# Patient Record
Sex: Female | Born: 1970 | Race: White | Hispanic: No | Marital: Married | Smoking: Former smoker
Health system: Southern US, Community
[De-identification: ages and names within clinical notes are randomized; demographics above are authoritative.]

## PROBLEM LIST (undated history)

## (undated) DIAGNOSIS — G43909 Migraine, unspecified, not intractable, without status migrainosus: Secondary | ICD-10-CM

## (undated) DIAGNOSIS — M1990 Unspecified osteoarthritis, unspecified site: Secondary | ICD-10-CM

## (undated) DIAGNOSIS — C9590 Leukemia, unspecified not having achieved remission: Secondary | ICD-10-CM

## (undated) DIAGNOSIS — E079 Disorder of thyroid, unspecified: Secondary | ICD-10-CM

## (undated) DIAGNOSIS — Z8719 Personal history of other diseases of the digestive system: Secondary | ICD-10-CM

## (undated) DIAGNOSIS — E039 Hypothyroidism, unspecified: Secondary | ICD-10-CM

## (undated) DIAGNOSIS — I509 Heart failure, unspecified: Secondary | ICD-10-CM

## (undated) DIAGNOSIS — M549 Dorsalgia, unspecified: Secondary | ICD-10-CM

## (undated) DIAGNOSIS — F419 Anxiety disorder, unspecified: Secondary | ICD-10-CM

## (undated) DIAGNOSIS — G8929 Other chronic pain: Secondary | ICD-10-CM

## (undated) DIAGNOSIS — C9200 Acute myeloblastic leukemia, not having achieved remission: Secondary | ICD-10-CM

## (undated) HISTORY — DX: Unspecified osteoarthritis, unspecified site: M19.90

## (undated) HISTORY — PX: FOOT SURGERY: SHX648

## (undated) HISTORY — DX: Personal history of other diseases of the digestive system: Z87.19

## (undated) HISTORY — PX: PORTACATH PLACEMENT: SHX2246

## (undated) HISTORY — PX: TUBAL LIGATION: SHX77

## (undated) HISTORY — DX: Hypothyroidism, unspecified: E03.9

## (undated) HISTORY — PX: CARDIAC DEFIBRILLATOR PLACEMENT: SHX171

## (undated) HISTORY — PX: FRACTURE SURGERY: SHX138

## (undated) HISTORY — PX: SALPINGOOPHORECTOMY: SHX82

## (undated) HISTORY — PX: ABDOMINAL HYSTERECTOMY: SHX81

---

## 2001-03-23 ENCOUNTER — Emergency Department (HOSPITAL_COMMUNITY): Admission: EM | Admit: 2001-03-23 | Discharge: 2001-03-23 | Payer: Self-pay | Admitting: *Deleted

## 2001-06-26 ENCOUNTER — Ambulatory Visit (HOSPITAL_COMMUNITY): Admission: RE | Admit: 2001-06-26 | Discharge: 2001-06-26 | Payer: Self-pay | Admitting: Obstetrics and Gynecology

## 2001-06-26 ENCOUNTER — Encounter: Payer: Self-pay | Admitting: Obstetrics and Gynecology

## 2001-11-30 ENCOUNTER — Inpatient Hospital Stay (HOSPITAL_COMMUNITY): Admission: RE | Admit: 2001-11-30 | Discharge: 2001-12-01 | Payer: Self-pay | Admitting: Obstetrics and Gynecology

## 2004-02-21 ENCOUNTER — Other Ambulatory Visit: Admission: RE | Admit: 2004-02-21 | Discharge: 2004-02-21 | Payer: Self-pay | Admitting: Dermatology

## 2005-04-03 ENCOUNTER — Ambulatory Visit (HOSPITAL_COMMUNITY): Admission: RE | Admit: 2005-04-03 | Discharge: 2005-04-03 | Payer: Self-pay | Admitting: Obstetrics and Gynecology

## 2006-01-24 ENCOUNTER — Emergency Department (HOSPITAL_COMMUNITY): Admission: EM | Admit: 2006-01-24 | Discharge: 2006-01-24 | Payer: Self-pay | Admitting: Emergency Medicine

## 2008-04-27 ENCOUNTER — Ambulatory Visit (HOSPITAL_COMMUNITY): Admission: RE | Admit: 2008-04-27 | Discharge: 2008-04-27 | Payer: Self-pay | Admitting: Internal Medicine

## 2009-03-01 ENCOUNTER — Emergency Department (HOSPITAL_COMMUNITY): Admission: EM | Admit: 2009-03-01 | Discharge: 2009-03-01 | Payer: Self-pay | Admitting: Emergency Medicine

## 2009-05-03 ENCOUNTER — Encounter
Admission: RE | Admit: 2009-05-03 | Discharge: 2009-05-03 | Payer: Self-pay | Admitting: Physical Medicine & Rehabilitation

## 2009-06-01 ENCOUNTER — Emergency Department (HOSPITAL_COMMUNITY): Admission: EM | Admit: 2009-06-01 | Discharge: 2009-06-01 | Payer: Self-pay | Admitting: Emergency Medicine

## 2009-08-18 ENCOUNTER — Emergency Department (HOSPITAL_COMMUNITY): Admission: EM | Admit: 2009-08-18 | Discharge: 2009-08-18 | Payer: Self-pay | Admitting: Emergency Medicine

## 2010-01-28 ENCOUNTER — Emergency Department (HOSPITAL_COMMUNITY): Admission: EM | Admit: 2010-01-28 | Discharge: 2010-01-28 | Payer: Self-pay | Admitting: Emergency Medicine

## 2010-05-24 ENCOUNTER — Emergency Department (HOSPITAL_COMMUNITY): Admission: EM | Admit: 2010-05-24 | Discharge: 2010-05-24 | Payer: Self-pay | Admitting: Emergency Medicine

## 2010-05-28 ENCOUNTER — Emergency Department (HOSPITAL_COMMUNITY): Admission: EM | Admit: 2010-05-28 | Discharge: 2010-05-28 | Payer: Self-pay | Admitting: Emergency Medicine

## 2010-06-01 ENCOUNTER — Ambulatory Visit (HOSPITAL_COMMUNITY): Admission: RE | Admit: 2010-06-01 | Discharge: 2010-06-01 | Payer: Self-pay | Admitting: Family Medicine

## 2010-07-01 ENCOUNTER — Emergency Department (HOSPITAL_COMMUNITY)
Admission: EM | Admit: 2010-07-01 | Discharge: 2010-07-01 | Payer: Self-pay | Source: Home / Self Care | Admitting: Emergency Medicine

## 2010-09-20 ENCOUNTER — Emergency Department (HOSPITAL_COMMUNITY)
Admission: EM | Admit: 2010-09-20 | Discharge: 2010-09-20 | Payer: Self-pay | Source: Home / Self Care | Admitting: Pediatrics

## 2010-11-11 ENCOUNTER — Encounter: Payer: Self-pay | Admitting: Obstetrics and Gynecology

## 2010-12-06 ENCOUNTER — Other Ambulatory Visit: Payer: Self-pay | Admitting: Obstetrics and Gynecology

## 2010-12-06 ENCOUNTER — Encounter (HOSPITAL_COMMUNITY): Payer: 59 | Attending: Obstetrics and Gynecology

## 2010-12-06 DIAGNOSIS — Z01812 Encounter for preprocedural laboratory examination: Secondary | ICD-10-CM | POA: Insufficient documentation

## 2010-12-06 LAB — BASIC METABOLIC PANEL
BUN: 8 mg/dL (ref 6–23)
Creatinine, Ser: 0.59 mg/dL (ref 0.4–1.2)
GFR calc non Af Amer: >60 mL/min (ref >60)
Glucose, Bld: 74 mg/dL (ref 70–99)
Sodium: 139 mEq/L (ref 135–145)

## 2010-12-06 LAB — SURGICAL PCR SCREEN
MRSA, PCR: NEGATIVE
Staphylococcus aureus: NEGATIVE

## 2010-12-06 LAB — CBC
HCT: 38.1 % (ref 36.0–46.0)
MCH: 32.5 pg (ref 26.0–34.0)
MCHC: 33.9 g/dL (ref 30.0–36.0)
RDW: 13 % (ref 11.5–15.5)

## 2010-12-07 LAB — TSH: TSH: 34.538 u[IU]/mL — ABNORMAL HIGH (ref 0.350–4.500)

## 2010-12-11 LAB — T4: T4, Total: 3.6 ug/dL — ABNORMAL LOW (ref 5.0–12.5)

## 2010-12-13 NOTE — H&P (Addendum)
  [name], [name]                ACCOUNT NO.:  [id number]  MEDICAL RECORD NO.:  [id number]           PATIENT TYPE:  LOCATION:                                 FACILITY:  PHYSICIAN:  Tilda Burrow, M.D. DATE OF BIRTH:  [birth year]  DATE OF ADMISSION:  12/06/2010 DATE OF DISCHARGE:  LH                             HISTORY & PHYSICAL   ADMISSION DIAGNOSES: 1. Symptomatic left ovarian cyst. 2. Hemorrhagic cyst versus endometrioma. 3. History of prior hysterectomy. 4. History of prior right salpingo-oophorectomy.  This 40 year old female status post vaginal hysterectomy many years ago with associated right salpingo-oophorectomy is seen in our office for persistent pain on left side that has been bothering her for "awhile and months" with severe dyspareunia associated with it for several months. Review of records show she has had intermittent cysts on the left side as far back as 2006.  When the ovary was visualized in August 2006, there was a 2.8 x 3.0 ovary with multiple small functional cysts.  Since that time, there has been significant increase in size.  On ultrasound on November 29, 2010, the ovary was 5.8 x 5.6 x 5.0 cm in diameter with a cyst within structure that is 5.3 cm x 3.8 cm x 3.4 cm.  This has a heterogeneous character fluid within it, most consistent with an endometrioma or hemorrhagic cyst.  The patient desires ovarian preservation if at all possible.  Plans are to try to extract the cyst if possible.  This patient is aware if this is not possible, then salpingo-oophorectomy may be necessary.  She is aware of this.  She is aware the risks to adjacent organs with ureter described as one of the organs, we will be watching for it during the surgery.  Injury to bowel, bladder, or bleeding requiring additional surgery have been discussed.  PAST MEDICAL HISTORY:  Positive for hypothyroidism.  SURGICAL HISTORY:  Positive for abdominal hysterectomy, appendectomy,  C- section x2, tubal ligation having been done many years before 1994.  She had a right salpingo-oophorectomy for adnexal torsion in 1988.  PHYSICAL EXAMINATION:  GENERAL:  A somber Caucasian female, alert and oriented x3. HEENT:  Pupils are equal, round, and reactive.  Extraocular movements are intact. NECK:  Supple. CHEST:  Clear to auscultation. ABDOMEN:  Slim without masses.  Well-healed surgical scar. EXTERNAL GENITALIA:  Normal.  Moderate posterior wall laxity. EXTREMITIES:  Without cyanosis, clubbing, or edema.  ASSESSMENT:  Hemorrhaging left ovarian cyst or endometrioma, symptomatic.  PLAN:  Laparoscopic left cystectomy, possible left salpingo- oophorectomy.  Bowel prep prior to surgery with serum TSH to be checked during preop labs.     Tilda Burrow, M.D.     JVF/MEDQ  D:  12/03/2010  T:  12/04/2010  Job:  [id number]  Electronically Signed by Christin Bach M.D. on 12/13/2010 06:33:39 PM

## 2010-12-17 ENCOUNTER — Other Ambulatory Visit: Payer: Self-pay | Admitting: Obstetrics and Gynecology

## 2010-12-17 ENCOUNTER — Encounter (HOSPITAL_COMMUNITY): Payer: 59 | Attending: Obstetrics and Gynecology

## 2010-12-17 DIAGNOSIS — Z01812 Encounter for preprocedural laboratory examination: Secondary | ICD-10-CM | POA: Insufficient documentation

## 2010-12-17 LAB — T3 UPTAKE: T3 Uptake Ratio: 38.3 % — ABNORMAL HIGH (ref 22.5–37.0)

## 2010-12-18 ENCOUNTER — Other Ambulatory Visit: Payer: Self-pay | Admitting: Obstetrics and Gynecology

## 2010-12-18 ENCOUNTER — Ambulatory Visit (HOSPITAL_COMMUNITY)
Admission: RE | Admit: 2010-12-18 | Discharge: 2010-12-18 | Disposition: A | Discharge status: Home / Self Care | Payer: 59 | Source: Ambulatory Visit | Attending: Obstetrics and Gynecology | Admitting: Obstetrics and Gynecology

## 2010-12-18 DIAGNOSIS — N83209 Unspecified ovarian cyst, unspecified side: Secondary | ICD-10-CM | POA: Insufficient documentation

## 2010-12-18 DIAGNOSIS — N736 Female pelvic peritoneal adhesions (postinfective): Secondary | ICD-10-CM | POA: Insufficient documentation

## 2010-12-20 NOTE — Op Note (Signed)
NAMEGRACIANA, [name]                ACCOUNT NO.:  [id number]  MEDICAL RECORD NO.:  [id number]           PATIENT TYPE:  O  LOCATION:  [facility]                          FACILITY:  [facility]  PHYSICIAN:  Tilda Burrow, M.D. DATE OF BIRTH:  11-13-1970  DATE OF PROCEDURE:  12/18/2010 DATE OF DISCHARGE:                              OPERATIVE REPORT   PREOPERATIVE DIAGNOSIS:  Symptomatic left ovarian cyst. POSTOPERATIVE DIAGNOSES:  Left ovarian cyst and left adnexal adhesions.  PROCEDURE:  Laparoscopic left salpingo-oophorectomy, lysis of adhesions.  SURGEON:  Tilda Burrow, MD ASSISTANt: Marlana Salvage, CST  ANESTHESIA:  General with endotracheal intubation. COMPLICATIONS:  None.  FINDINGS:  Extensive adhesions of left ovary to the left pelvic sidewall, closely adherent to the ureter, but able to be excised without complication.  No evidence of intra-abdominal adhesions, evidence of prior appendectomy, hysterectomy, and right salpingo-oophorectomy.  INDICATIONS:  A 40 year old female with symptomatic left tube and ovary undergoing laparoscopic evaluation and treatment.  DETAILS OF PROCEDURE:  The patient was taken to the operating room, prepped and draped.  Time-out conducted, procedure confirmed by involved parties.  IV antibiotics were not considered necessary.  Flowtrons were in place.  An infraumbilical vertical incision was made as well as a transverse suprapubic 1-cm incision at the site of an old surgical scar. Additionally, the right lower quadrant had a 5-mm trocar placed.  Veress needle head was first placed through the umbilicus using care to orient it towards the pelvis.  Water droplet test confirmed intraperitoneal location and pneumoperitoneum was achieved 3 liters under 6 mmHg pressure.  An 11-mm umbilical trocar was inserted, and a 5-mm suprapubic and right lower quadrant trocar placed under direct visualization. Pelvis was inspected.  Photo 1 shows normal bowel, tip of the  Foley catheter visible through the bladder and minimal adhesions.  Photo 2 shows the densely adherent left tube and ovary as does photo 3.  Careful inspection was performed in initial efforts, cautery were used to try to free up some of the inferior adhesions.  It became apparent that the adhesions were too dense and that salpingo-oophorectomy was considered indicated.  At this time, pelvic anatomy was inspected sufficiently to identify the left ureter in its retroperitoneal location where it coursed just beneath the ovary.  There seemed to be a small distance between the two, so retroperitoneal dissection was performed lateral to the tube and ovary, and a surgical cleavage plane made medial to the retroperitoneal vessels.  Sharp dissection using Harmonic scalpel was used to crossclamp the left infundibulopelvic ligament, transected, and gradually mobilize the ovary using care to stay close to the ovarian surfaces.  Periodic recheck of the ureter was performed to ensure that we were still well away from it.  Peristalsis could be identified regularly and during this process.  Photos 5, 6, and 7 show the ends of the final portions of the dissection with the ureter visualized as intact a few millimeters from the area of the use of harmonic scalpel. The patient tolerated the procedure well.  Irrigation showed that the area was hemostatic.  An EndoCatch bag was placed through the  suprapubic trocar site, which was converted to a 12-mm port.  The bag was unable to be extracted through the dense fibrous tissue in this area, so a larger dissection transversely was required.  The incision was eventually about 3 cm wide at the skin and similar width in the fascial layer.  At this time the tube and ovary in EndoCatch bag could be extracted.  The patient tolerated the procedure well.  Pelvis was irrigated.  Hemostasis confirmed.  Ureter again confirmed as actively peristaltic without any suspicion or  damage.  The deflation of the abdomen followed with closure of the fascial layer with 0 Vicryl at the umbilical and suprapubic site and then closure of the skin with subcuticular running 3-0 Vicryl at the suprapubic site and single subcuticular stitches at each of the site.  Steri-Strips were placed.  The patient went to recovery room in good condition.  The patient will be discharged stable, would be placed on hormone replacement therapy with Vivelle-Dot patches as well as hydrocodone and Zofran for pain and nausea.     Tilda Burrow, M.D.     JVF/MEDQ  D:  12/18/2010  T:  12/19/2010  Job:  [id number]  Electronically Signed by Christin Bach M.D. on 12/20/2010 08:48:02 PM

## 2011-01-01 LAB — URINALYSIS, ROUTINE W REFLEX MICROSCOPIC
Hgb urine dipstick: NEGATIVE
Nitrite: NEGATIVE
Protein, ur: NEGATIVE mg/dL
Urobilinogen, UA: 0.2 mg/dL (ref 0.0–1.0)

## 2011-01-04 LAB — DIFFERENTIAL
Basophils Absolute: 0 K/uL (ref 0.0–0.1)
Basophils Relative: 0 % (ref 0–1)
Eosinophils Absolute: 0.1 10*3/uL (ref 0.0–0.7)
Eosinophils Relative: 1 % (ref 0–5)
Lymphocytes Relative: 27 % (ref 12–46)
Lymphs Abs: 2.1 K/uL (ref 0.7–4.0)
Monocytes Absolute: 0.3 K/uL (ref 0.1–1.0)
Monocytes Relative: 4 % (ref 3–12)
Neutro Abs: 5.5 K/uL (ref 1.7–7.7)
Neutrophils Relative %: 68 % (ref 43–77)

## 2011-01-04 LAB — BASIC METABOLIC PANEL WITH GFR
CO2: 24 meq/L (ref 19–32)
Creatinine, Ser: 0.54 mg/dL (ref 0.4–1.2)
Glucose, Bld: 99 mg/dL (ref 70–99)
Potassium: 3.8 meq/L (ref 3.5–5.1)
Sodium: 141 meq/L (ref 135–145)

## 2011-01-04 LAB — URINALYSIS, ROUTINE W REFLEX MICROSCOPIC
Bilirubin Urine: NEGATIVE
Bilirubin Urine: NEGATIVE
Glucose, UA: NEGATIVE mg/dL
Glucose, UA: NEGATIVE mg/dL
Ketones, ur: NEGATIVE mg/dL
Nitrite: NEGATIVE
Protein, ur: NEGATIVE mg/dL
Urobilinogen, UA: 0.2 mg/dL (ref 0.0–1.0)
pH: 5 (ref 5.0–8.0)
pH: 7 (ref 5.0–8.0)

## 2011-01-04 LAB — URINE MICROSCOPIC-ADD ON

## 2011-01-04 LAB — BASIC METABOLIC PANEL
BUN: 5 mg/dL — ABNORMAL LOW (ref 6–23)
Calcium: 8.8 mg/dL (ref 8.4–10.5)
Chloride: 112 mEq/L (ref 96–112)
GFR calc Af Amer: >60 mL/min (ref >60)
GFR calc non Af Amer: >60 mL/min (ref >60)

## 2011-01-04 LAB — CBC
HCT: 36.8 % (ref 36.0–46.0)
Hemoglobin: 12.6 g/dL (ref 12.0–15.0)
MCH: 33.4 pg (ref 26.0–34.0)
MCHC: 34.2 g/dL (ref 30.0–36.0)
MCV: 97.6 fL (ref 78.0–100.0)
Platelets: 230 10*3/uL (ref 150–400)
RBC: 3.78 MIL/uL — ABNORMAL LOW (ref 3.87–5.11)
RDW: 13 % (ref 11.5–15.5)
WBC: 8.1 K/uL (ref 4.0–10.5)

## 2011-01-04 LAB — URINE CULTURE
Colony Count: 60000
Culture  Setup Time: 201108090157

## 2011-01-12 ENCOUNTER — Emergency Department (HOSPITAL_COMMUNITY)
Admission: EM | Admit: 2011-01-12 | Discharge: 2011-01-12 | Disposition: A | Discharge status: Home / Self Care | Payer: 59 | Attending: Emergency Medicine | Admitting: Emergency Medicine

## 2011-01-12 DIAGNOSIS — F3289 Other specified depressive episodes: Secondary | ICD-10-CM | POA: Insufficient documentation

## 2011-01-12 DIAGNOSIS — M26609 Unspecified temporomandibular joint disorder, unspecified side: Secondary | ICD-10-CM | POA: Insufficient documentation

## 2011-01-12 DIAGNOSIS — F329 Major depressive disorder, single episode, unspecified: Secondary | ICD-10-CM | POA: Insufficient documentation

## 2011-01-12 DIAGNOSIS — R51 Headache: Secondary | ICD-10-CM | POA: Insufficient documentation

## 2011-01-12 DIAGNOSIS — M549 Dorsalgia, unspecified: Secondary | ICD-10-CM | POA: Insufficient documentation

## 2011-01-12 DIAGNOSIS — G8929 Other chronic pain: Secondary | ICD-10-CM | POA: Insufficient documentation

## 2011-01-24 LAB — DIFFERENTIAL
Eosinophils Absolute: 0.2 10*3/uL (ref 0.0–0.7)
Monocytes Absolute: 0.2 10*3/uL (ref 0.1–1.0)
Monocytes Relative: 2 % — ABNORMAL LOW (ref 3–12)
Neutrophils Relative %: 68 % (ref 43–77)

## 2011-01-24 LAB — CBC
Hemoglobin: 14.9 g/dL (ref 12.0–15.0)
MCV: 97.9 fL (ref 78.0–100.0)
RBC: 4.52 MIL/uL (ref 3.87–5.11)
RDW: 14.1 % (ref 11.5–15.5)
WBC: 12.8 10*3/uL — ABNORMAL HIGH (ref 4.0–10.5)

## 2011-01-24 LAB — RAPID URINE DRUG SCREEN, HOSP PERFORMED
Amphetamines: NOT DETECTED
Barbiturates: NOT DETECTED
Cocaine: NOT DETECTED
Opiates: POSITIVE — AB
Tetrahydrocannabinol: NOT DETECTED

## 2011-01-24 LAB — BASIC METABOLIC PANEL
CO2: 25 mEq/L (ref 19–32)
Calcium: 9.2 mg/dL (ref 8.4–10.5)
Creatinine, Ser: 0.67 mg/dL (ref 0.4–1.2)
GFR calc Af Amer: >60 mL/min (ref >60)
Glucose, Bld: 87 mg/dL (ref 70–99)
Potassium: 3.9 mEq/L (ref 3.5–5.1)

## 2011-02-08 ENCOUNTER — Emergency Department (HOSPITAL_COMMUNITY)
Admission: EM | Admit: 2011-02-08 | Discharge: 2011-02-08 | Disposition: A | Discharge status: Home / Self Care | Payer: Self-pay | Attending: Emergency Medicine | Admitting: Emergency Medicine

## 2011-02-08 DIAGNOSIS — M26609 Unspecified temporomandibular joint disorder, unspecified side: Secondary | ICD-10-CM | POA: Insufficient documentation

## 2011-02-08 DIAGNOSIS — G4700 Insomnia, unspecified: Secondary | ICD-10-CM | POA: Insufficient documentation

## 2011-02-08 DIAGNOSIS — R51 Headache: Secondary | ICD-10-CM | POA: Insufficient documentation

## 2011-08-05 ENCOUNTER — Other Ambulatory Visit: Payer: Self-pay | Admitting: Obstetrics and Gynecology

## 2011-08-15 ENCOUNTER — Other Ambulatory Visit: Payer: Self-pay | Admitting: Obstetrics and Gynecology

## 2012-01-02 ENCOUNTER — Other Ambulatory Visit: Payer: Self-pay | Admitting: Obstetrics and Gynecology

## 2012-03-19 ENCOUNTER — Encounter (HOSPITAL_COMMUNITY): Payer: Self-pay

## 2012-03-19 ENCOUNTER — Emergency Department (HOSPITAL_COMMUNITY)
Admission: EM | Admit: 2012-03-19 | Discharge: 2012-03-19 | Disposition: A | Discharge status: Home / Self Care | Payer: Self-pay | Attending: Emergency Medicine | Admitting: Emergency Medicine

## 2012-03-19 ENCOUNTER — Emergency Department (HOSPITAL_COMMUNITY): Payer: Self-pay

## 2012-03-19 DIAGNOSIS — X58XXXA Exposure to other specified factors, initial encounter: Secondary | ICD-10-CM | POA: Insufficient documentation

## 2012-03-19 DIAGNOSIS — S46919A Strain of unspecified muscle, fascia and tendon at shoulder and upper arm level, unspecified arm, initial encounter: Secondary | ICD-10-CM

## 2012-03-19 DIAGNOSIS — IMO0002 Reserved for concepts with insufficient information to code with codable children: Secondary | ICD-10-CM | POA: Insufficient documentation

## 2012-03-19 DIAGNOSIS — E079 Disorder of thyroid, unspecified: Secondary | ICD-10-CM | POA: Insufficient documentation

## 2012-03-19 HISTORY — DX: Disorder of thyroid, unspecified: E07.9

## 2012-03-19 MED ORDER — HYDROCODONE-ACETAMINOPHEN 5-325 MG PO TABS: 1.0000 | ORAL_TABLET | ORAL | Status: DC | PRN

## 2012-03-19 MED ORDER — MELOXICAM 7.5 MG PO TABS: ORAL_TABLET | ORAL | Status: DC

## 2012-03-19 NOTE — ED Provider Notes (Signed)
History     CSN: [id number]  Arrival date & time 03/19/12  1201   First MD Initiated Contact with Patient 03/19/12 1258      Chief Complaint  Patient presents with  . Shoulder Pain    (Consider location/radiation/quality/duration/timing/severity/associated sxs/prior treatment) Patient is a 41 y.o. female presenting with shoulder pain. The history is provided by the patient.  Shoulder Pain This is a new problem. The current episode started 1 to 4 weeks ago. The problem occurs intermittently. The problem has been gradually worsening. Associated symptoms include arthralgias. Pertinent negatives include no abdominal pain, chest pain, coughing or neck pain. Exacerbated by: certain movement. She has tried nothing for the symptoms.    Past Medical History  Diagnosis Date  . Thyroid disease     Past Surgical History  Procedure Date  . Fracture surgery   . Abdominal hysterectomy     No family history on file.  History  Substance Use Topics  . Smoking status: Current Everyday Smoker  . Smokeless tobacco: Not on file  . Alcohol Use: No    OB History    Grav Para Term Preterm Abortions TAB SAB Ect Mult Living                  Review of Systems  Constitutional: Negative for activity change.       All ROS Neg except as noted in HPI  HENT: Negative for nosebleeds and neck pain.   Eyes: Negative for photophobia and discharge.  Respiratory: Negative for cough, shortness of breath and wheezing.   Cardiovascular: Negative for chest pain and palpitations.  Gastrointestinal: Negative for abdominal pain and blood in stool.  Genitourinary: Negative for dysuria, frequency and hematuria.  Musculoskeletal: Positive for arthralgias. Negative for back pain.  Skin: Negative.   Neurological: Negative for dizziness, seizures and speech difficulty.  Psychiatric/Behavioral: Negative for hallucinations and confusion.    Allergies  Review of patient's allergies indicates no known  allergies.  Home Medications  No current outpatient prescriptions on file.  BP 96/68  Pulse 90  Temp(Src) 98.4 F (36.9 C) (Oral)  Resp 20  Ht 5\' 6"  (1.676 m)  Wt 120 lb (54.432 kg)  BMI 19.37 kg/m2  SpO2 97%  Physical Exam  Nursing note and vitals reviewed. Constitutional: She is oriented to person, place, and time. She appears well-developed and well-nourished.  Non-toxic appearance.  HENT:  Head: Normocephalic.  Right Ear: Tympanic membrane and external ear normal.  Left Ear: Tympanic membrane and external ear normal.  Eyes: EOM and lids are normal. Pupils are equal, round, and reactive to light.  Neck: Normal range of motion. Neck supple. Carotid bruit is not present.  Cardiovascular: Normal rate, regular rhythm, normal heart sounds, intact distal pulses and normal pulses.   Pulmonary/Chest: Breath sounds normal. No respiratory distress.  Abdominal: Soft. Bowel sounds are normal. There is no tenderness. There is no guarding.  Musculoskeletal: Normal range of motion.       There is pain with range of motion of the left shoulder. There is no deformity appreciated. There is some crepitus present. Pain is more anterior. Distal pulses are symmetrical. Sensory is symmetrical.  Lymphadenopathy:       Head (right side): No submandibular adenopathy present.       Head (left side): No submandibular adenopathy present.    She has no cervical adenopathy.  Neurological: She is alert and oriented to person, place, and time. She has normal strength. No cranial nerve deficit or  sensory deficit.       Grip is symmetrical bilaterally.  Skin: Skin is warm and dry.  Psychiatric: She has a normal mood and affect. Her speech is normal.    ED Course  Procedures (including critical care time)  Labs Reviewed - No data to display Dg Shoulder Left  03/19/2012  *RADIOLOGY REPORT*  Clinical Data: Pain without trauma.  LEFT SHOULDER - 2+ VIEW  Comparison: None.  Findings: Minimal degenerative  irregularity of the acromioclavicular joint and undersurface of the acromion. No acute fracture or dislocation.  Visualized portion of the left hemithorax is normal.  IMPRESSION: No acute osseous abnormality.  Original Report Authenticated By: Consuello Bossier, M.D.     No diagnosis found.    MDM  I have reviewed nursing notes, vital signs, and all appropriate lab and imaging results for this patient. Exam consistent with shoulder strain. Pt fitted with sling. Treated with mobic, norco5mg .       Kathie Dike, PA 03/19/12 1337  Kathie Dike, [place] 03/19/12 1350

## 2012-03-19 NOTE — ED Notes (Signed)
Pain in left shoulder for two weeks

## 2012-03-19 NOTE — ED Notes (Signed)
Patient transported to X-ray 

## 2012-03-19 NOTE — ED Notes (Signed)
Pt with left shoulder pain for weeks

## 2012-03-19 NOTE — Discharge Instructions (Signed)
Please use the shoulder sling for the next 3 to 4 days. Mobic daily with food for inflammation. Norco for pain. Take with benadryl for itching. See Dr Gerda Diss for orthopedic referral if not improving.Shoulder Sprain A shoulder sprain is the result of damage to the tough, fiber-like tissues (ligaments) that help hold your shoulder in place. The ligaments may be stretched or torn. Besides the main shoulder joint (the ball and socket), there are several smaller joints that connect the bones in this area. A sprain usually involves one of those joints. Most often it is the acromioclavicular (or AC) joint. That is the joint that connects the collarbone (clavicle) and the shoulder blade (scapula) at the top point of the shoulder blade (acromion). A shoulder sprain is a mild form of what is called a shoulder separation. Recovering from a shoulder sprain may take some time. For some, pain lingers for several months. Most people recover without long term problems. CAUSES   A shoulder sprain is usually caused by some kind of trauma. This might be:   Falling on an outstretched arm.   Being hit hard on the shoulder.   Twisting the arm.   Shoulder sprains are more likely to occur in people who:   Play sports.   Have balance or coordination problems.  SYMPTOMS   Pain when you move your shoulder.   Limited ability to move the shoulder.   Swelling and tenderness on top of the shoulder.   Redness or warmth in the shoulder.   Bruising.   A change in the shape of the shoulder.  DIAGNOSIS  Your healthcare provider may:  Ask about your symptoms.   Ask about recent activity that might have caused those symptoms.   Examine your shoulder. You may be asked to do simple exercises to test movement. The other shoulder will be examined for comparison.   Order some tests that provide a look inside the body. They can show the extent of the injury. The tests could include:   X-rays.   CT (computed  tomography) scan.   MRI (magnetic resonance imaging) scan.  RISKS AND COMPLICATIONS  Loss of full shoulder motion.   Ongoing shoulder pain.  TREATMENT  How long it takes to recover from a shoulder sprain depends on how severe it was. Treatment options may include:  Rest. You should not use the arm or shoulder until it heals.   Ice. For 2 or 3 days after the injury, put an ice pack on the shoulder up to 4 times a day. It should stay on for 15 to 20 minutes each time. Wrap the ice in a towel so it does not touch your skin.   Over-the-counter medicine to relieve pain.   A sling or brace. This will keep the arm still while the shoulder is healing.   Physical therapy or rehabilitation exercises. These will help you regain strength and motion. Ask your healthcare provider when it is OK to begin these exercises.   Surgery. The need for surgery is rare with a sprained shoulder, but some people may need surgery to keep the joint in place and reduce pain.  HOME CARE INSTRUCTIONS   Ask your healthcare provider about what you should and should not do while your shoulder heals.   Make sure you know how to apply ice to the correct area of your shoulder.   Talk with your healthcare provider about which medications should be used for pain and swelling.   If rehabilitation therapy will  be needed, ask your healthcare provider to refer you to a therapist. If it is not recommended, then ask about at-home exercises. Find out when exercise should begin.  SEEK MEDICAL CARE IF:  Your pain, swelling, or redness at the joint increases. SEEK IMMEDIATE MEDICAL CARE IF:   You have a fever.   You cannot move your arm or shoulder.  Document Released: 02/23/2009 Document Revised: 09/26/2011 Document Reviewed: 02/23/2009 [facility] Patient Information 2012 [place], [place].

## 2012-03-20 NOTE — ED Provider Notes (Signed)
Medical screening examination/treatment/procedure(s) were performed by non-physician practitioner and as supervising physician I was immediately available for consultation/collaboration.  Sonji Starkes R. Lennon Richins, MD 03/20/12 0658 

## 2012-03-25 ENCOUNTER — Encounter (HOSPITAL_COMMUNITY): Payer: Self-pay | Admitting: Oncology

## 2012-03-25 ENCOUNTER — Emergency Department (HOSPITAL_COMMUNITY)
Admission: EM | Admit: 2012-03-25 | Discharge: 2012-03-25 | Disposition: A | Discharge status: Home / Self Care | Payer: BC Managed Care – PPO | Attending: Emergency Medicine | Admitting: Emergency Medicine

## 2012-03-25 ENCOUNTER — Other Ambulatory Visit: Payer: Self-pay | Admitting: Family Medicine

## 2012-03-25 DIAGNOSIS — Z79899 Other long term (current) drug therapy: Secondary | ICD-10-CM | POA: Insufficient documentation

## 2012-03-25 DIAGNOSIS — M25512 Pain in left shoulder: Secondary | ICD-10-CM

## 2012-03-25 DIAGNOSIS — E079 Disorder of thyroid, unspecified: Secondary | ICD-10-CM | POA: Insufficient documentation

## 2012-03-25 DIAGNOSIS — M25519 Pain in unspecified shoulder: Secondary | ICD-10-CM | POA: Insufficient documentation

## 2012-03-25 DIAGNOSIS — F17200 Nicotine dependence, unspecified, uncomplicated: Secondary | ICD-10-CM | POA: Insufficient documentation

## 2012-03-25 HISTORY — DX: Anxiety disorder, unspecified: F41.9

## 2012-03-25 MED ORDER — OXYCODONE-ACETAMINOPHEN 10-325 MG PO TABS: 1.0000 | ORAL_TABLET | ORAL | Status: AC | PRN

## 2012-03-25 MED ORDER — CLONAZEPAM 0.5 MG PO TABS: 1.0000 mg | ORAL_TABLET | Freq: Two times a day (BID) | ORAL | Status: DC | PRN

## 2012-03-25 NOTE — ED Notes (Signed)
[name] states she is presenting to the ER for left shoulder pain; reports that she is going for MRIs soon and to see Romeo Apple.  Is in need of pain meds until she is able.

## 2012-03-25 NOTE — ED Provider Notes (Signed)
History     CSN: [id number]  Arrival date & time 03/25/12  1107   First MD Initiated Contact with Patient 03/25/12 1119      Chief Complaint  Patient presents with  . Shoulder Pain    left    (Consider location/radiation/quality/duration/timing/severity/associated sxs/prior treatment) HPI....chronic left shoulder pain for several weeks. Has appointment with orthopedic surgeon.   MRI scheduled for next week.no Radicular symptoms. Palpation and movement makes pain worse. Rest makes pain better. Also complains of muscle spasm.severity is moderate and sharp  Past Medical History  Diagnosis Date  . Thyroid disease   . Anxiety     Past Surgical History  Procedure Date  . Abdominal hysterectomy   . Fracture surgery     pelvis, right arm    No family history on file.  History  Substance Use Topics  . Smoking status: Current Everyday Smoker  . Smokeless tobacco: Not on file  . Alcohol Use: No    OB History    Grav Para Term Preterm Abortions TAB SAB Ect Mult Living                  Review of Systems  All other systems reviewed and are negative.    Allergies  Review of patient's allergies indicates no known allergies.  Home Medications   Current Outpatient Rx  Name Route Sig Dispense Refill  . CLONAZEPAM 2 MG PO TABS Oral Take 2 mg by mouth 3 (three) times daily as needed. For anxiety    . ESTRACE PO Oral Take 1 tablet by mouth daily.    Marland Kitchen HYDROCODONE-ACETAMINOPHEN 5-325 MG PO TABS Oral Take 1 tablet by mouth every 4 (four) hours as needed.    Marland Kitchen LEVOTHYROXINE SODIUM 100 MCG PO TABS Oral Take 100 mcg by mouth daily.    . MELOXICAM 7.5 MG PO TABS  1 po bid with food    . CLONAZEPAM 0.5 MG PO TABS Oral Take 2 tablets (1 mg total) by mouth 2 (two) times daily as needed for anxiety. 15 tablet 0  . OXYCODONE-ACETAMINOPHEN 10-325 MG PO TABS Oral Take 1 tablet by mouth every 4 (four) hours as needed for pain. 30 tablet 0    BP 117/86  Pulse 97  Temp(Src) 98.3 F  (36.8 C) (Oral)  Resp 16  Ht 5\' 6"  (1.676 m)  Wt 125 lb (56.7 kg)  BMI 20.18 kg/m2  SpO2 100%  Physical Exam  Nursing note and vitals reviewed. Constitutional: She is oriented to person, place, and time. She appears well-developed and well-nourished.  HENT:  Head: Normocephalic and atraumatic.  Eyes: Conjunctivae and EOM are normal. Pupils are equal, round, and reactive to light.  Neck: Normal range of motion. Neck supple.  Musculoskeletal:       Left shoulder: Pain with range of motion. Distal pulses intact   Neurological: She is alert and oriented to person, place, and time.  Skin: Skin is warm and dry.  Psychiatric: She has a normal mood and affect.    ED Course  Procedures (including critical care time)  Labs Reviewed - No data to display No results found.   1. Pain in left shoulder       MDM  Patient needs pain management and Rx for muscle spasm. Has defined appointment with orthopedic surgeon and MRI scheduled. We'll attempt to control pain and muscle spasm for the next 10-15 days while she is waiting for final workup        Donnetta Hutching,  MD 03/25/12 1311

## 2012-03-25 NOTE — ED Notes (Signed)
Pain lt shoulder for 2 mos, here for pain control.  Being evaluated by Dr Romeo Apple.  Requests to see Dr Adriana Simas.Pt does not know how or if she  Injured her shoulder.

## 2012-03-25 NOTE — Discharge Instructions (Signed)
followup with your orthopedic doctor for MRI scan. Medication for pain and muscle relaxation. Ice, elevation, rest.

## 2012-04-02 ENCOUNTER — Ambulatory Visit (HOSPITAL_COMMUNITY): Admission: RE | Admit: 2012-04-02 | Payer: BC Managed Care – PPO | Source: Ambulatory Visit

## 2012-05-01 ENCOUNTER — Other Ambulatory Visit: Payer: Self-pay | Admitting: Obstetrics and Gynecology

## 2012-05-15 ENCOUNTER — Emergency Department (HOSPITAL_COMMUNITY)
Admission: EM | Admit: 2012-05-15 | Discharge: 2012-05-16 | Disposition: A | Discharge status: Home / Self Care | Payer: Self-pay | Attending: Emergency Medicine | Admitting: Emergency Medicine

## 2012-05-15 ENCOUNTER — Encounter (HOSPITAL_COMMUNITY): Payer: Self-pay | Admitting: *Deleted

## 2012-05-15 DIAGNOSIS — Z79899 Other long term (current) drug therapy: Secondary | ICD-10-CM | POA: Insufficient documentation

## 2012-05-15 DIAGNOSIS — F411 Generalized anxiety disorder: Secondary | ICD-10-CM | POA: Insufficient documentation

## 2012-05-15 DIAGNOSIS — F1920 Other psychoactive substance dependence, uncomplicated: Secondary | ICD-10-CM

## 2012-05-15 DIAGNOSIS — E079 Disorder of thyroid, unspecified: Secondary | ICD-10-CM | POA: Insufficient documentation

## 2012-05-15 DIAGNOSIS — F17200 Nicotine dependence, unspecified, uncomplicated: Secondary | ICD-10-CM | POA: Insufficient documentation

## 2012-05-15 DIAGNOSIS — F1110 Opioid abuse, uncomplicated: Secondary | ICD-10-CM | POA: Insufficient documentation

## 2012-05-15 LAB — URINALYSIS, ROUTINE W REFLEX MICROSCOPIC
Glucose, UA: NEGATIVE mg/dL
Ketones, ur: NEGATIVE mg/dL
Leukocytes, UA: NEGATIVE
Nitrite: NEGATIVE
Protein, ur: NEGATIVE mg/dL

## 2012-05-15 LAB — CBC WITH DIFFERENTIAL/PLATELET
Basophils Absolute: 0 10*3/uL (ref 0.0–0.1)
Basophils Relative: 0 % (ref 0–1)
Eosinophils Relative: 4 % (ref 0–5)
HCT: 38.1 % (ref 36.0–46.0)
MCHC: 33.6 g/dL (ref 30.0–36.0)
MCV: 94.8 fL (ref 78.0–100.0)
Monocytes Absolute: 0.4 10*3/uL (ref 0.1–1.0)
Monocytes Relative: 6 % (ref 3–12)
RDW: 14.3 % (ref 11.5–15.5)

## 2012-05-15 LAB — BASIC METABOLIC PANEL
BUN: 10 mg/dL (ref 6–23)
CO2: 26 mEq/L (ref 19–32)
Calcium: 9.2 mg/dL (ref 8.4–10.5)
Creatinine, Ser: 0.59 mg/dL (ref 0.50–1.10)
GFR calc Af Amer: >90 mL/min (ref >90)

## 2012-05-15 LAB — TSH: TSH: 8.678 u[IU]/mL — ABNORMAL HIGH (ref 0.350–4.500)

## 2012-05-15 LAB — RAPID URINE DRUG SCREEN, HOSP PERFORMED
Opiates: POSITIVE — AB
Tetrahydrocannabinol: NOT DETECTED

## 2012-05-15 LAB — ETHANOL: Alcohol, Ethyl (B): <11 mg/dL (ref 0–11)

## 2012-05-15 MED ORDER — ALUM & MAG HYDROXIDE-SIMETH 200-200-20 MG/5ML PO SUSP
30.0000 mL | ORAL | Status: DC | PRN
Start: 2012-05-15 — End: 2012-05-16

## 2012-05-15 MED ORDER — NICOTINE 21 MG/24HR TD PT24
21.0000 mg | MEDICATED_PATCH | Freq: Every day | TRANSDERMAL | Status: DC
  Administered 2012-05-15: 21 mg via TRANSDERMAL
  Filled 2012-05-15: qty 1

## 2012-05-15 MED ORDER — ZOLPIDEM TARTRATE 5 MG PO TABS
5.0000 mg | ORAL_TABLET | Freq: Every evening | ORAL | Status: DC | PRN
  Administered 2012-05-15: 5 mg via ORAL
  Filled 2012-05-15: qty 1

## 2012-05-15 MED ORDER — LEVOTHYROXINE SODIUM 100 MCG PO TABS
100.0000 ug | ORAL_TABLET | Freq: Every day | ORAL | Status: DC
  Filled 2012-05-15: qty 1

## 2012-05-15 MED ORDER — IBUPROFEN 600 MG PO TABS
600.0000 mg | ORAL_TABLET | Freq: Three times a day (TID) | ORAL | Status: DC | PRN
  Administered 2012-05-15: 600 mg via ORAL
  Filled 2012-05-15: qty 1

## 2012-05-15 MED ORDER — CLONAZEPAM 1 MG PO TABS
2.0000 mg | ORAL_TABLET | Freq: Three times a day (TID) | ORAL | Status: DC | PRN
  Administered 2012-05-15: 2 mg via ORAL
  Filled 2012-05-15: qty 2

## 2012-05-15 MED ORDER — ONDANSETRON HCL 4 MG PO TABS: 4.0000 mg | ORAL_TABLET | Freq: Three times a day (TID) | ORAL | Status: DC | PRN

## 2012-05-15 MED ORDER — ACETAMINOPHEN 325 MG PO TABS: 650.0000 mg | ORAL_TABLET | ORAL | Status: DC | PRN

## 2012-05-15 MED ORDER — LORAZEPAM 1 MG PO TABS
1.0000 mg | ORAL_TABLET | Freq: Three times a day (TID) | ORAL | Status: DC | PRN
  Administered 2012-05-15: 1 mg via ORAL
  Filled 2012-05-15: qty 1

## 2012-05-15 NOTE — ED Provider Notes (Signed)
History     CSN: [id number]  Arrival date & time 05/15/12  0909   First MD Initiated Contact with Patient 05/15/12 0913      Chief Complaint  Patient presents with  . Medical Clearance    (Consider location/radiation/quality/duration/timing/severity/associated sxs/prior treatment) HPI..... 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  Takes up to 20 Vicodin equivalents per day.  Has had trouble with opiate addiction since 04/07/2006 when her brother died. One previous admission to [facility].  No suicidal or homicidal ideation.  Minimal anxiety.  History of hypothyroidism. No other somatic complaints.  Past Medical History  Diagnosis Date  . Thyroid disease   . Anxiety     Past Surgical History  Procedure Date  . Abdominal hysterectomy   . Fracture surgery     pelvis, right arm    No family history on file.  History  Substance Use Topics  . Smoking status: Current Everyday Smoker  . Smokeless tobacco: Not on file  . Alcohol Use: No    OB History    Grav Para Term Preterm Abortions TAB SAB Ect Mult Living                  Review of Systems  All other systems reviewed and are negative.    Allergies  Review of patient's allergies indicates no known allergies.  Home Medications   Current Outpatient Rx  Name Route Sig Dispense Refill  . CLONAZEPAM 2 MG PO TABS Oral Take 2 mg by mouth 3 (three) times daily as needed. For anxiety    . ESTRADIOL 1 MG PO TABS Oral Take 1 mg by mouth daily.    Marland Kitchen HYDROCODONE-ACETAMINOPHEN 7.5-325 MG PO TABS Oral Take 6-8 tablets by mouth every 6 (six) hours as needed. For pain.    Marland Kitchen LEVOTHYROXINE SODIUM 100 MCG PO TABS Oral Take 100 mcg by mouth daily.    . OXYCODONE-ACETAMINOPHEN 5-500 MG PO TABS Oral Take 6-8 tablets by mouth every 6 (six) hours as needed. For pain.      BP 108/77  Pulse 100  Temp 98.3 F (36.8 C) (Oral)  Resp 16  SpO2 100%  Physical Exam  Nursing note and vitals reviewed. Constitutional: She is oriented to person, place, and time.  She appears well-developed and well-nourished.  HENT:  Head: Normocephalic and atraumatic.  Eyes: Conjunctivae and EOM are normal. Pupils are equal, round, and reactive to light.  Neck: Normal range of motion. Neck supple.  Cardiovascular: Normal rate and regular rhythm.   Pulmonary/Chest: Effort normal and breath sounds normal.  Abdominal: Soft. Bowel sounds are normal.  Musculoskeletal: Normal range of motion.  Neurological: She is alert and oriented to person, place, and time.  Skin: Skin is warm and dry.  Psychiatric: She has a normal mood and affect.    ED Course  Procedures (including critical care time)   Labs Reviewed  CBC WITH DIFFERENTIAL  BASIC METABOLIC PANEL  URINE RAPID DRUG SCREEN (HOSP PERFORMED)  ETHANOL  URINALYSIS, ROUTINE W REFLEX MICROSCOPIC   No results found.   No diagnosis found.    MDM  Patient is lucid. Consult behavioral health.        Donnetta Hutching, MD 05/15/12 1014

## 2012-05-15 NOTE — ED Notes (Signed)
Pt up to bathroom.

## 2012-05-15 NOTE — ED Notes (Signed)
Patient changed into paper scrubs and checked by security. Patient belongings checked by security. 1 bag placed in triage locker 4

## 2012-05-15 NOTE — ED Notes (Signed)
MD notified of pt's VS, he advised to just keep an eye on it for now.

## 2012-05-15 NOTE — ED Notes (Signed)
Contacted EDP about TSH level since I don't think the comment i put in was seen and also her home meds need to be ordered.
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
## 2012-05-15 NOTE — Progress Notes (Signed)
Pt has been accepted to [facility]. Pt's [place] Authorization # is [id number] for 3 days given by Marylene Land. Pt has a 2 week supply of her medications and her husband has agreed to transport. EDP has been notified and is in agreement with the disposition. RN made aware to call report. No further needs identified at this time.

## 2012-05-15 NOTE — ED Notes (Signed)
[name], CSW in with pt doing ACT assessment.

## 2012-05-15 NOTE — ED Notes (Signed)
Pt reports wanting detox from hydrocodone/percocet. Last use last night. Usually uses 20-30 pills per day PO. Denies SI/HI. Calm/cooperative. [facility] and was told to come here for help with detox.

## 2012-05-15 NOTE — Progress Notes (Signed)
Pt's information was sent to [facility] at 17:24. CSW contacted Baird Lyons at [facility] at 20:30 who stated she had not reviewed the information at this time though would contact this CSW back this evening. CSW updated the pt on the progress of her placement. CSW will continue to follow.

## 2012-05-16 NOTE — ED Notes (Signed)
Patient discharge to [facility] with husband via ambulatory with steady gait. Respirations equal and unlabored. Skin warm and dry. No acute distress noted.

## 2013-03-29 ENCOUNTER — Telehealth: Payer: Self-pay | Admitting: Obstetrics and Gynecology

## 2013-03-30 NOTE — Telephone Encounter (Signed)
Left patient a message that pharmacy can request medications refills unless there is pain medicines. If pain medicines are being requested patient will need to be seen

## 2013-03-31 ENCOUNTER — Telehealth: Payer: Self-pay | Admitting: *Deleted

## 2013-03-31 ENCOUNTER — Telehealth: Payer: Self-pay | Admitting: Obstetrics and Gynecology

## 2013-03-31 NOTE — Telephone Encounter (Signed)
Pt requesting Dr. Emelda Fear to return her call, pt missed his phone call from yesterday.

## 2013-03-31 NOTE — Telephone Encounter (Signed)
Pt requesting Dr. Emelda Fear to refill prescription for Valium and Celexa that pt has been receiving from [facility]. Informed pt would need an appt before Dr. Emelda Fear could prescribe these medications. Transferred call up to front staff to verify charges and appt availability.

## 2013-04-06 ENCOUNTER — Encounter: Payer: Self-pay | Admitting: *Deleted

## 2013-04-07 ENCOUNTER — Ambulatory Visit: Payer: Self-pay | Admitting: Obstetrics and Gynecology

## 2013-04-07 ENCOUNTER — Telehealth: Payer: Self-pay | Admitting: Obstetrics and Gynecology

## 2013-04-07 NOTE — Telephone Encounter (Signed)
Spoke with pt. Missed appt for today, she thought appt was for tomorrow. Pt wants Dr. Emelda Fear to refill Celexa and Valium, about to run out. Explained that pt would have to be seen before any meds would be given. Dr. Emelda Fear off tomorrow so appt scheduled for Friday. JSY

## 2013-04-09 ENCOUNTER — Ambulatory Visit: Payer: Self-pay | Admitting: Obstetrics and Gynecology

## 2013-04-12 ENCOUNTER — Telehealth: Payer: Self-pay

## 2013-04-12 NOTE — Telephone Encounter (Signed)
I spoke with patient regarding appointment with Dr. Emelda Fear for purpose of Celexa and Valium refills.  I explained to her that Dr. Emelda Fear is an OB/GYN MD and she should seek a primary care MD or return to [facility], as they prescribed these medications for her.  She then stated that she no longer goes to [facility], but did not state why.  She then went on to state that she also needed Synthroid.  I advised the patient that she cannot be seen at this practice for these specific types of medications.  I explained to her again that she needs to seek a PCP and or [facility] MD's for these medications.

## 2013-05-02 ENCOUNTER — Emergency Department (HOSPITAL_COMMUNITY)
Admission: EM | Admit: 2013-05-02 | Discharge: 2013-05-02 | Disposition: A | Discharge status: Home / Self Care | Payer: Self-pay | Attending: Emergency Medicine | Admitting: Emergency Medicine

## 2013-05-02 ENCOUNTER — Encounter (HOSPITAL_COMMUNITY): Payer: Self-pay | Admitting: Emergency Medicine

## 2013-05-02 DIAGNOSIS — L03319 Cellulitis of trunk, unspecified: Secondary | ICD-10-CM | POA: Insufficient documentation

## 2013-05-02 DIAGNOSIS — M129 Arthropathy, unspecified: Secondary | ICD-10-CM | POA: Insufficient documentation

## 2013-05-02 DIAGNOSIS — E039 Hypothyroidism, unspecified: Secondary | ICD-10-CM | POA: Insufficient documentation

## 2013-05-02 DIAGNOSIS — Z8781 Personal history of (healed) traumatic fracture: Secondary | ICD-10-CM | POA: Insufficient documentation

## 2013-05-02 DIAGNOSIS — Z79899 Other long term (current) drug therapy: Secondary | ICD-10-CM | POA: Insufficient documentation

## 2013-05-02 DIAGNOSIS — R110 Nausea: Secondary | ICD-10-CM | POA: Insufficient documentation

## 2013-05-02 DIAGNOSIS — L02219 Cutaneous abscess of trunk, unspecified: Secondary | ICD-10-CM | POA: Insufficient documentation

## 2013-05-02 DIAGNOSIS — Z8719 Personal history of other diseases of the digestive system: Secondary | ICD-10-CM | POA: Insufficient documentation

## 2013-05-02 DIAGNOSIS — F411 Generalized anxiety disorder: Secondary | ICD-10-CM | POA: Insufficient documentation

## 2013-05-02 DIAGNOSIS — L0291 Cutaneous abscess, unspecified: Secondary | ICD-10-CM

## 2013-05-02 DIAGNOSIS — F17200 Nicotine dependence, unspecified, uncomplicated: Secondary | ICD-10-CM | POA: Insufficient documentation

## 2013-05-02 MED ORDER — SULFAMETHOXAZOLE-TRIMETHOPRIM 800-160 MG PO TABS: 1.0000 | ORAL_TABLET | Freq: Two times a day (BID) | ORAL | Status: DC

## 2013-05-02 MED ORDER — IBUPROFEN 800 MG PO TABS: 800.0000 mg | ORAL_TABLET | Freq: Three times a day (TID) | ORAL | Status: DC

## 2013-05-02 MED ORDER — FLUCONAZOLE 200 MG PO TABS: 200.0000 mg | ORAL_TABLET | Freq: Once | ORAL | Status: AC

## 2013-05-02 NOTE — ED Notes (Signed)
Patient c/o possible spider bite to upper abd. Per patient started out as a blister 4 days ago. Patient states "The blister popped and I thought it was going to be okay but then it started draining green fluid." Per patient p[ainful to touch. Patient reports feeling "achey last night" but unsure of fever.
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
## 2013-05-05 NOTE — ED Provider Notes (Signed)
Medical screening examination/treatment/procedure(s) were performed by non-physician practitioner and as supervising physician I was immediately available for consultation/collaboration.   Joya Gaskins, MD 05/05/13 [phone]

## 2013-05-06 ENCOUNTER — Emergency Department (HOSPITAL_COMMUNITY)
Admission: EM | Admit: 2013-05-06 | Discharge: 2013-05-06 | Disposition: A | Discharge status: Home / Self Care | Payer: Self-pay | Attending: Emergency Medicine | Admitting: Emergency Medicine

## 2013-05-06 ENCOUNTER — Encounter (HOSPITAL_COMMUNITY): Payer: Self-pay | Admitting: *Deleted

## 2013-05-06 ENCOUNTER — Telehealth: Payer: Self-pay | Admitting: Obstetrics and Gynecology

## 2013-05-06 DIAGNOSIS — Z79899 Other long term (current) drug therapy: Secondary | ICD-10-CM | POA: Insufficient documentation

## 2013-05-06 DIAGNOSIS — F17200 Nicotine dependence, unspecified, uncomplicated: Secondary | ICD-10-CM | POA: Insufficient documentation

## 2013-05-06 DIAGNOSIS — Z8639 Personal history of other endocrine, nutritional and metabolic disease: Secondary | ICD-10-CM | POA: Insufficient documentation

## 2013-05-06 DIAGNOSIS — F410 Panic disorder [episodic paroxysmal anxiety] without agoraphobia: Secondary | ICD-10-CM | POA: Insufficient documentation

## 2013-05-06 DIAGNOSIS — R209 Unspecified disturbances of skin sensation: Secondary | ICD-10-CM | POA: Insufficient documentation

## 2013-05-06 DIAGNOSIS — Z8739 Personal history of other diseases of the musculoskeletal system and connective tissue: Secondary | ICD-10-CM | POA: Insufficient documentation

## 2013-05-06 DIAGNOSIS — Z862 Personal history of diseases of the blood and blood-forming organs and certain disorders involving the immune mechanism: Secondary | ICD-10-CM | POA: Insufficient documentation

## 2013-05-06 DIAGNOSIS — F411 Generalized anxiety disorder: Secondary | ICD-10-CM | POA: Insufficient documentation

## 2013-05-06 MED ORDER — DIAZEPAM 5 MG PO TABS
5.0000 mg | ORAL_TABLET | Freq: Once | ORAL | Status: AC
  Administered 2013-05-06: 5 mg via ORAL
  Filled 2013-05-06: qty 1

## 2013-05-06 MED ORDER — DIAZEPAM 5 MG PO TABS: 5.0000 mg | ORAL_TABLET | Freq: Two times a day (BID) | ORAL | Status: DC

## 2013-05-06 NOTE — ED Notes (Signed)
States that she started having nerve problems last night.  States that she completed her course of Valium and was supposed to see a primary care physician but has not.  States that she was planning on seeing Dr. Emelda Fear, however is financially unable to do so at this point.
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
## 2013-05-06 NOTE — ED Notes (Signed)
Recovering addict, clean just at 1 year, ran out of Valium day before yesterday.  Now having anxiety attacks.  Unable to sleep last. Usually gets meds from [facility], but they wanted her to see a PMD before they refilled.  She does not have a PMD.

## 2013-05-06 NOTE — ED Notes (Signed)
Ride home verified.

## 2013-05-11 ENCOUNTER — Telehealth: Payer: Self-pay | Admitting: *Deleted

## 2013-05-11 NOTE — Telephone Encounter (Signed)
Left msg for pt to call office 9a-3p tomorrow. jvferg PT WILL NEED TO SEE [facility]ATHER THAN ME CALLING rX'S WITHOUT APPTS.

## 2013-05-12 ENCOUNTER — Telehealth: Payer: Self-pay | Admitting: Obstetrics and Gynecology

## 2013-05-12 NOTE — Telephone Encounter (Signed)
I spoke to patient last evening; patient agrees to come in for appt to discuss anxiolytic medications.  Pt will call for appt.

## 2013-05-12 NOTE — Telephone Encounter (Signed)
Message copied by Tilda Burrow on Wed May 12, 2013  1:50 PM ------      Message from: [place], [place] S      Created: Wed May 12, 2013  8:43 AM                   ----- Message -----         From: Elinor Parkinson, CNA         Sent: 05/11/2013   4:27 PM           To: [facility]            Pt wants Dr. Emelda Fear to call her.  Said she left message last week and this morning.  She can be reached at [phone].  She said it was a Personnel officer. ------

## 2013-05-12 NOTE — Telephone Encounter (Signed)
Pt states has an appt scheduled

## 2013-05-18 ENCOUNTER — Encounter: Payer: Self-pay | Admitting: Obstetrics and Gynecology

## 2013-05-18 ENCOUNTER — Ambulatory Visit (INDEPENDENT_AMBULATORY_CARE_PROVIDER_SITE_OTHER): Payer: Self-pay | Admitting: Obstetrics and Gynecology

## 2013-05-18 VITALS — BP 110/72 | Ht 66.0 in | Wt 147.0 lb

## 2013-05-18 DIAGNOSIS — F32A Depression, unspecified: Secondary | ICD-10-CM

## 2013-05-18 DIAGNOSIS — F3289 Other specified depressive episodes: Secondary | ICD-10-CM

## 2013-05-18 DIAGNOSIS — F329 Major depressive disorder, single episode, unspecified: Secondary | ICD-10-CM

## 2013-05-18 MED ORDER — CLONAZEPAM 1 MG PO TBDP: 1.0000 mg | ORAL_TABLET | Freq: Two times a day (BID) | ORAL | Status: DC | PRN

## 2013-05-18 MED ORDER — CITALOPRAM HYDROBROMIDE 40 MG PO TABS: 40.0000 mg | ORAL_TABLET | Freq: Every day | ORAL | Status: AC

## 2013-05-18 NOTE — Progress Notes (Signed)
Patient ID: [name], female   DOB: 1971/07/26, 42 y.o.   MRN: [id number] Pt states here today for medication refills.

## 2013-05-21 ENCOUNTER — Telehealth: Payer: Self-pay | Admitting: *Deleted

## 2013-05-21 NOTE — Telephone Encounter (Signed)
Pt called stating that [facility] pharmacy told pt to have RX for klonopin sent to [facility] since pt is getting all other meds there. Please advise.

## 2013-05-24 ENCOUNTER — Telehealth: Payer: Self-pay | Admitting: *Deleted

## 2013-05-24 NOTE — Telephone Encounter (Signed)
Spoke with pt. [facility] in [place], but pt always uses [facility] in [place] for other prescriptions. She wants to get Klonopin filled at [facility] in [place]. Can I call this in?

## 2013-05-24 NOTE — Telephone Encounter (Signed)
Dr. Emelda Fear is to discuss with pharmacist at [facility] drugstore.

## 2013-06-14 ENCOUNTER — Telehealth: Payer: Self-pay | Admitting: Internal Medicine

## 2013-06-14 ENCOUNTER — Emergency Department (HOSPITAL_COMMUNITY)
Admission: EM | Admit: 2013-06-14 | Discharge: 2013-06-15 | Disposition: A | Discharge status: Other Acute Inpatient Hospital | Payer: Medicaid Other | Attending: Emergency Medicine | Admitting: Emergency Medicine

## 2013-06-14 ENCOUNTER — Encounter (HOSPITAL_COMMUNITY): Payer: Self-pay | Admitting: *Deleted

## 2013-06-14 ENCOUNTER — Emergency Department (HOSPITAL_COMMUNITY): Payer: Medicaid Other

## 2013-06-14 DIAGNOSIS — T148XXA Other injury of unspecified body region, initial encounter: Secondary | ICD-10-CM

## 2013-06-14 DIAGNOSIS — F17200 Nicotine dependence, unspecified, uncomplicated: Secondary | ICD-10-CM | POA: Insufficient documentation

## 2013-06-14 DIAGNOSIS — G43909 Migraine, unspecified, not intractable, without status migrainosus: Secondary | ICD-10-CM | POA: Insufficient documentation

## 2013-06-14 DIAGNOSIS — R000 Tachycardia, unspecified: Secondary | ICD-10-CM | POA: Insufficient documentation

## 2013-06-14 DIAGNOSIS — Z79899 Other long term (current) drug therapy: Secondary | ICD-10-CM | POA: Insufficient documentation

## 2013-06-14 DIAGNOSIS — F419 Anxiety disorder, unspecified: Secondary | ICD-10-CM

## 2013-06-14 DIAGNOSIS — C9500 Acute leukemia of unspecified cell type not having achieved remission: Secondary | ICD-10-CM

## 2013-06-14 DIAGNOSIS — R195 Other fecal abnormalities: Secondary | ICD-10-CM | POA: Insufficient documentation

## 2013-06-14 DIAGNOSIS — Z90710 Acquired absence of both cervix and uterus: Secondary | ICD-10-CM | POA: Insufficient documentation

## 2013-06-14 DIAGNOSIS — Z9079 Acquired absence of other genital organ(s): Secondary | ICD-10-CM | POA: Insufficient documentation

## 2013-06-14 DIAGNOSIS — M7989 Other specified soft tissue disorders: Secondary | ICD-10-CM | POA: Insufficient documentation

## 2013-06-14 DIAGNOSIS — Z8781 Personal history of (healed) traumatic fracture: Secondary | ICD-10-CM | POA: Insufficient documentation

## 2013-06-14 DIAGNOSIS — F411 Generalized anxiety disorder: Secondary | ICD-10-CM | POA: Insufficient documentation

## 2013-06-14 DIAGNOSIS — X58XXXA Exposure to other specified factors, initial encounter: Secondary | ICD-10-CM | POA: Insufficient documentation

## 2013-06-14 DIAGNOSIS — R630 Anorexia: Secondary | ICD-10-CM | POA: Insufficient documentation

## 2013-06-14 DIAGNOSIS — E876 Hypokalemia: Secondary | ICD-10-CM

## 2013-06-14 DIAGNOSIS — E039 Hypothyroidism, unspecified: Secondary | ICD-10-CM | POA: Insufficient documentation

## 2013-06-14 DIAGNOSIS — Z9851 Tubal ligation status: Secondary | ICD-10-CM | POA: Insufficient documentation

## 2013-06-14 DIAGNOSIS — R21 Rash and other nonspecific skin eruption: Secondary | ICD-10-CM | POA: Insufficient documentation

## 2013-06-14 DIAGNOSIS — Y939 Activity, unspecified: Secondary | ICD-10-CM | POA: Insufficient documentation

## 2013-06-14 DIAGNOSIS — D649 Anemia, unspecified: Secondary | ICD-10-CM

## 2013-06-14 DIAGNOSIS — Z8739 Personal history of other diseases of the musculoskeletal system and connective tissue: Secondary | ICD-10-CM | POA: Insufficient documentation

## 2013-06-14 DIAGNOSIS — Z8719 Personal history of other diseases of the digestive system: Secondary | ICD-10-CM | POA: Insufficient documentation

## 2013-06-14 DIAGNOSIS — R110 Nausea: Secondary | ICD-10-CM | POA: Insufficient documentation

## 2013-06-14 DIAGNOSIS — Y929 Unspecified place or not applicable: Secondary | ICD-10-CM | POA: Insufficient documentation

## 2013-06-14 DIAGNOSIS — K922 Gastrointestinal hemorrhage, unspecified: Secondary | ICD-10-CM

## 2013-06-14 DIAGNOSIS — D696 Thrombocytopenia, unspecified: Secondary | ICD-10-CM

## 2013-06-14 DIAGNOSIS — R5381 Other malaise: Secondary | ICD-10-CM | POA: Insufficient documentation

## 2013-06-14 DIAGNOSIS — Z87828 Personal history of other (healed) physical injury and trauma: Secondary | ICD-10-CM | POA: Insufficient documentation

## 2013-06-14 HISTORY — DX: Migraine, unspecified, not intractable, without status migrainosus: G43.909

## 2013-06-14 LAB — CBC WITH DIFFERENTIAL/PLATELET
Basophils Relative: 1 % (ref 0–1)
Blasts: 96 %
Eosinophils Absolute: 0 10*3/uL (ref 0.0–0.7)
HCT: 13.7 % — ABNORMAL LOW (ref 36.0–46.0)
Hemoglobin: 4.6 g/dL — CL (ref 12.0–15.0)
Lymphocytes Relative: 1 % — ABNORMAL LOW (ref 12–46)
MCHC: 33.6 g/dL (ref 30.0–36.0)
MCV: 93.2 fL (ref 78.0–100.0)
Monocytes Absolute: 2.5 10*3/uL — ABNORMAL HIGH (ref 0.1–1.0)
Monocytes Relative: 1 % — ABNORMAL LOW (ref 3–12)
Neutro Abs: 2.5 10*3/uL (ref 1.7–7.7)
RDW: 19.7 % — ABNORMAL HIGH (ref 11.5–15.5)

## 2013-06-14 LAB — URINALYSIS, ROUTINE W REFLEX MICROSCOPIC
Nitrite: NEGATIVE
Specific Gravity, Urine: <1.005 — ABNORMAL LOW (ref 1.005–1.030)
Urobilinogen, UA: 0.2 mg/dL (ref 0.0–1.0)
pH: 5.5 (ref 5.0–8.0)

## 2013-06-14 LAB — PROTIME-INR
INR: 1.48 (ref 0.00–1.49)
Prothrombin Time: 17.5 seconds — ABNORMAL HIGH (ref 11.6–15.2)

## 2013-06-14 LAB — RAPID URINE DRUG SCREEN, HOSP PERFORMED
Amphetamines: NOT DETECTED
Benzodiazepines: NOT DETECTED
Cocaine: NOT DETECTED
Opiates: NOT DETECTED

## 2013-06-14 LAB — COMPREHENSIVE METABOLIC PANEL
Albumin: 3.8 g/dL (ref 3.5–5.2)
BUN: 18 mg/dL (ref 6–23)
Calcium: 9.1 mg/dL (ref 8.4–10.5)
Creatinine, Ser: 0.66 mg/dL (ref 0.50–1.10)
GFR calc Af Amer: >90 mL/min (ref >90)
Glucose, Bld: 110 mg/dL — ABNORMAL HIGH (ref 70–99)
Total Protein: 6.3 g/dL (ref 6.0–8.3)

## 2013-06-14 LAB — URIC ACID: Uric Acid, Serum: 5.9 mg/dL (ref 2.4–7.0)

## 2013-06-14 LAB — URINE MICROSCOPIC-ADD ON

## 2013-06-14 LAB — TROPONIN I: Troponin I: <0.3 ng/mL (ref <0.30)

## 2013-06-14 LAB — APTT: aPTT: 29 seconds (ref 24–37)

## 2013-06-14 LAB — ABO/RH: ABO/RH(D): B NEG

## 2013-06-14 LAB — LACTATE DEHYDROGENASE: LDH: 2667 U/L — ABNORMAL HIGH (ref 94–250)

## 2013-06-14 MED ORDER — POTASSIUM CHLORIDE CRYS ER 20 MEQ PO TBCR
40.0000 meq | EXTENDED_RELEASE_TABLET | Freq: Once | ORAL | Status: AC
  Administered 2013-06-14: 40 meq via ORAL
  Filled 2013-06-14: qty 2

## 2013-06-14 MED ORDER — IOHEXOL 350 MG/ML SOLN: 100.0000 mL | Freq: Once | INTRAVENOUS | Status: AC | PRN

## 2013-06-14 MED ORDER — HYDROMORPHONE HCL PF 1 MG/ML IJ SOLN
1.0000 mg | Freq: Once | INTRAMUSCULAR | Status: AC
  Administered 2013-06-14: 1 mg via INTRAVENOUS
  Filled 2013-06-14: qty 1

## 2013-06-14 MED ORDER — LORAZEPAM 2 MG/ML IJ SOLN
1.0000 mg | Freq: Once | INTRAMUSCULAR | Status: AC
  Administered 2013-06-14: 1 mg via INTRAVENOUS
  Filled 2013-06-14: qty 1

## 2013-06-14 MED ORDER — SODIUM CHLORIDE 0.9 % IV BOLUS (SEPSIS)
1000.0000 mL | Freq: Once | INTRAVENOUS | Status: AC
  Administered 2013-06-14: 1000 mL via INTRAVENOUS
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
## 2013-06-14 NOTE — ED Notes (Signed)
CRITICAL VALUE ALERT  Critical value received:  Potasium 2.7  Date of notification:  06/14/13  Time of notification:  2017  Critical value read back:yes  Nurse who received alert:  Lawernce Ion  MD notified (1st page):  Bernette Mayers  Time of first page:  2017  MD notified (2nd page):  Time of second page:  Responding MD:  Bernette Mayers  Time MD responded:  2018

## 2013-06-14 NOTE — ED Notes (Signed)
CRITICAL VALUE ALERT  Critical value received:  WBC 249.3                                        Hemoglobin 4.6                                        Platelets 25  Date of notification:  06/14/13  Time of notification:  1938  Critical value read back:yes  Nurse who received alert:  Thornton Dales, RN  MD notified (1st page):  Bernette Mayers  Time of first page:  1940  MD notified (2nd page):  Time of second page:  Responding MD:  Bernette Mayers  Time MD responded:  [phone]

## 2013-06-14 NOTE — ED Notes (Signed)
Radiology called saying they blew the IV. Attempted once in ct, EDP states he will attempt when pt returns.

## 2013-06-14 NOTE — Telephone Encounter (Signed)
Discussed with Dr. Bernette Mayers of [facility] that we send these acute patients to [facility] ER.  He will also contact hematology fellow on call to make him/her aware that the  patient is being transferred.

## 2013-06-14 NOTE — ED Notes (Addendum)
Sob, legs swollen, Recent road trip to [place].  Seen in Er in [place] for migraine.says she was given a large dose of  Solu medrol for her headache.  Sores in mouth .  Feels weak,  Visual disturbance.  Contusions to legs

## 2013-06-15 LAB — OCCULT BLOOD, POC DEVICE: Fecal Occult Bld: POSITIVE — AB

## 2013-06-15 MED ORDER — HYDROMORPHONE HCL PF 1 MG/ML IJ SOLN
1.0000 mg | Freq: Once | INTRAMUSCULAR | Status: AC
  Administered 2013-06-15: 1 mg via INTRAVENOUS
  Filled 2013-06-15: qty 1

## 2013-06-15 NOTE — ED Notes (Signed)
Pt states her headache is starting to return.

## 2013-06-16 LAB — TYPE AND SCREEN
Antibody Screen: NEGATIVE
Unit division: 0

## 2013-06-16 LAB — PATHOLOGIST SMEAR REVIEW

## 2013-06-20 ENCOUNTER — Other Ambulatory Visit: Payer: Self-pay | Admitting: Oncology

## 2013-08-10 ENCOUNTER — Other Ambulatory Visit (HOSPITAL_COMMUNITY): Payer: Self-pay

## 2013-08-12 ENCOUNTER — Encounter (HOSPITAL_COMMUNITY): Payer: Medicaid Other | Attending: Hematology and Oncology

## 2013-08-12 DIAGNOSIS — Z09 Encounter for follow-up examination after completed treatment for conditions other than malignant neoplasm: Secondary | ICD-10-CM | POA: Insufficient documentation

## 2013-08-12 DIAGNOSIS — C9590 Leukemia, unspecified not having achieved remission: Secondary | ICD-10-CM

## 2013-08-12 LAB — CBC
HCT: 38.3 % (ref 36.0–46.0)
Hemoglobin: 12.5 g/dL (ref 12.0–15.0)
MCH: 31.5 pg (ref 26.0–34.0)
MCHC: 32.6 g/dL (ref 30.0–36.0)
RDW: 24.8 % — ABNORMAL HIGH (ref 11.5–15.5)

## 2013-08-12 LAB — COMPREHENSIVE METABOLIC PANEL
Albumin: 3.6 g/dL (ref 3.5–5.2)
BUN: 13 mg/dL (ref 6–23)
Calcium: 10.3 mg/dL (ref 8.4–10.5)
Creatinine, Ser: 0.67 mg/dL (ref 0.50–1.10)
GFR calc Af Amer: >90 mL/min (ref >90)
Glucose, Bld: 178 mg/dL — ABNORMAL HIGH (ref 70–99)
Total Protein: 7.6 g/dL (ref 6.0–8.3)

## 2013-08-12 LAB — DIFFERENTIAL
Eosinophils Absolute: 0.1 10*3/uL (ref 0.0–0.7)
Lymphocytes Relative: 16 % (ref 12–46)
Lymphs Abs: 0.8 10*3/uL (ref 0.7–4.0)
Monocytes Relative: 10 % (ref 3–12)
Neutro Abs: 3.3 10*3/uL (ref 1.7–7.7)
Neutrophils Relative %: 71 % (ref 43–77)

## 2013-08-12 LAB — MAGNESIUM: Magnesium: 2.2 mg/dL (ref 1.5–2.5)

## 2013-08-12 NOTE — Progress Notes (Signed)
Labs drawn today foe cbc/diff,cmp,mg

## 2013-08-13 ENCOUNTER — Other Ambulatory Visit (HOSPITAL_COMMUNITY): Payer: Self-pay

## 2013-08-15 ENCOUNTER — Encounter (HOSPITAL_COMMUNITY): Payer: Self-pay | Admitting: Emergency Medicine

## 2013-08-15 ENCOUNTER — Emergency Department (HOSPITAL_COMMUNITY)
Admission: EM | Admit: 2013-08-15 | Discharge: 2013-08-15 | Disposition: A | Discharge status: Home / Self Care | Payer: Medicaid Other | Attending: Emergency Medicine | Admitting: Emergency Medicine

## 2013-08-15 DIAGNOSIS — Z95810 Presence of automatic (implantable) cardiac defibrillator: Secondary | ICD-10-CM | POA: Insufficient documentation

## 2013-08-15 DIAGNOSIS — R031 Nonspecific low blood-pressure reading: Secondary | ICD-10-CM | POA: Insufficient documentation

## 2013-08-15 DIAGNOSIS — F411 Generalized anxiety disorder: Secondary | ICD-10-CM | POA: Insufficient documentation

## 2013-08-15 DIAGNOSIS — R5381 Other malaise: Secondary | ICD-10-CM | POA: Insufficient documentation

## 2013-08-15 DIAGNOSIS — G43909 Migraine, unspecified, not intractable, without status migrainosus: Secondary | ICD-10-CM | POA: Insufficient documentation

## 2013-08-15 DIAGNOSIS — Z87891 Personal history of nicotine dependence: Secondary | ICD-10-CM | POA: Insufficient documentation

## 2013-08-15 DIAGNOSIS — E039 Hypothyroidism, unspecified: Secondary | ICD-10-CM | POA: Insufficient documentation

## 2013-08-15 DIAGNOSIS — M549 Dorsalgia, unspecified: Secondary | ICD-10-CM | POA: Insufficient documentation

## 2013-08-15 DIAGNOSIS — I509 Heart failure, unspecified: Secondary | ICD-10-CM | POA: Insufficient documentation

## 2013-08-15 DIAGNOSIS — Z760 Encounter for issue of repeat prescription: Secondary | ICD-10-CM

## 2013-08-15 DIAGNOSIS — Z8719 Personal history of other diseases of the digestive system: Secondary | ICD-10-CM | POA: Insufficient documentation

## 2013-08-15 DIAGNOSIS — M129 Arthropathy, unspecified: Secondary | ICD-10-CM | POA: Insufficient documentation

## 2013-08-15 DIAGNOSIS — Z79899 Other long term (current) drug therapy: Secondary | ICD-10-CM | POA: Insufficient documentation

## 2013-08-15 DIAGNOSIS — R000 Tachycardia, unspecified: Secondary | ICD-10-CM | POA: Insufficient documentation

## 2013-08-15 DIAGNOSIS — G8929 Other chronic pain: Secondary | ICD-10-CM | POA: Insufficient documentation

## 2013-08-15 DIAGNOSIS — Z862 Personal history of diseases of the blood and blood-forming organs and certain disorders involving the immune mechanism: Secondary | ICD-10-CM | POA: Insufficient documentation

## 2013-08-15 HISTORY — DX: Heart failure, unspecified: I50.9

## 2013-08-15 HISTORY — DX: Leukemia, unspecified not having achieved remission: C95.90

## 2013-08-15 MED ORDER — HYDROMORPHONE HCL PF 1 MG/ML IJ SOLN
1.0000 mg | Freq: Once | INTRAMUSCULAR | Status: AC
  Administered 2013-08-15: 1 mg via INTRAMUSCULAR
  Filled 2013-08-15: qty 1

## 2013-08-15 MED ORDER — HYDROMORPHONE HCL 2 MG PO TABS: 4.0000 mg | ORAL_TABLET | Freq: Four times a day (QID) | ORAL | Status: DC | PRN
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
## 2013-08-15 NOTE — ED Notes (Signed)
Ran out of pain meds last night.  Dilaudid 2mg  tabs.  C/o pain to mid back.

## 2013-08-18 ENCOUNTER — Ambulatory Visit: Payer: Self-pay | Admitting: Obstetrics and Gynecology

## 2013-08-22 NOTE — ED Provider Notes (Signed)
Medical screening examination/treatment/procedure(s) were performed by non-physician practitioner and as supervising physician I was immediately available for consultation/collaboration.  Hurman Horn, MD 08/22/13 [phone]

## 2013-08-26 ENCOUNTER — Ambulatory Visit (HOSPITAL_COMMUNITY): Payer: Self-pay

## 2013-08-30 ENCOUNTER — Encounter (HOSPITAL_COMMUNITY): Payer: Self-pay | Admitting: Emergency Medicine

## 2013-08-30 ENCOUNTER — Emergency Department (HOSPITAL_COMMUNITY)
Admission: EM | Admit: 2013-08-30 | Discharge: 2013-08-31 | Disposition: A | Discharge status: Nursing Home (Includes ICF) | Payer: Medicaid Other | Attending: Emergency Medicine | Admitting: Emergency Medicine

## 2013-08-30 ENCOUNTER — Emergency Department (HOSPITAL_COMMUNITY): Payer: Medicaid Other

## 2013-08-30 DIAGNOSIS — Z8739 Personal history of other diseases of the musculoskeletal system and connective tissue: Secondary | ICD-10-CM | POA: Insufficient documentation

## 2013-08-30 DIAGNOSIS — E039 Hypothyroidism, unspecified: Secondary | ICD-10-CM | POA: Insufficient documentation

## 2013-08-30 DIAGNOSIS — Z95810 Presence of automatic (implantable) cardiac defibrillator: Secondary | ICD-10-CM | POA: Insufficient documentation

## 2013-08-30 DIAGNOSIS — D709 Neutropenia, unspecified: Secondary | ICD-10-CM | POA: Insufficient documentation

## 2013-08-30 DIAGNOSIS — C9590 Leukemia, unspecified not having achieved remission: Secondary | ICD-10-CM | POA: Insufficient documentation

## 2013-08-30 DIAGNOSIS — Z792 Long term (current) use of antibiotics: Secondary | ICD-10-CM | POA: Insufficient documentation

## 2013-08-30 DIAGNOSIS — K5900 Constipation, unspecified: Secondary | ICD-10-CM | POA: Insufficient documentation

## 2013-08-30 DIAGNOSIS — Z856 Personal history of leukemia: Secondary | ICD-10-CM | POA: Insufficient documentation

## 2013-08-30 DIAGNOSIS — D696 Thrombocytopenia, unspecified: Secondary | ICD-10-CM

## 2013-08-30 DIAGNOSIS — Z79899 Other long term (current) drug therapy: Secondary | ICD-10-CM | POA: Insufficient documentation

## 2013-08-30 DIAGNOSIS — G43909 Migraine, unspecified, not intractable, without status migrainosus: Secondary | ICD-10-CM | POA: Insufficient documentation

## 2013-08-30 DIAGNOSIS — G8929 Other chronic pain: Secondary | ICD-10-CM | POA: Insufficient documentation

## 2013-08-30 DIAGNOSIS — I509 Heart failure, unspecified: Secondary | ICD-10-CM | POA: Insufficient documentation

## 2013-08-30 DIAGNOSIS — F411 Generalized anxiety disorder: Secondary | ICD-10-CM | POA: Insufficient documentation

## 2013-08-30 DIAGNOSIS — D649 Anemia, unspecified: Secondary | ICD-10-CM

## 2013-08-30 DIAGNOSIS — F17200 Nicotine dependence, unspecified, uncomplicated: Secondary | ICD-10-CM | POA: Insufficient documentation

## 2013-08-30 HISTORY — DX: Dorsalgia, unspecified: M54.9

## 2013-08-30 HISTORY — DX: Acute myeloblastic leukemia, not having achieved remission: C92.00

## 2013-08-30 HISTORY — DX: Other chronic pain: G89.29

## 2013-08-30 LAB — CBC WITH DIFFERENTIAL/PLATELET
HCT: 22 % — ABNORMAL LOW (ref 36.0–46.0)
Hemoglobin: 7.8 g/dL — ABNORMAL LOW (ref 12.0–15.0)
MCV: 94 fL (ref 78.0–100.0)
RBC: 2.34 MIL/uL — ABNORMAL LOW (ref 3.87–5.11)
RDW: 20.5 % — ABNORMAL HIGH (ref 11.5–15.5)
WBC: 0.2 10*3/uL — CL (ref 4.0–10.5)

## 2013-08-30 LAB — URINALYSIS W MICROSCOPIC + REFLEX CULTURE
Bilirubin Urine: NEGATIVE
Glucose, UA: NEGATIVE mg/dL
Hgb urine dipstick: NEGATIVE
Nitrite: NEGATIVE
Specific Gravity, Urine: >1.03 — ABNORMAL HIGH (ref 1.005–1.030)
Urobilinogen, UA: 0.2 mg/dL (ref 0.0–1.0)

## 2013-08-30 LAB — BASIC METABOLIC PANEL
Calcium: 9.3 mg/dL (ref 8.4–10.5)
Creatinine, Ser: 0.63 mg/dL (ref 0.50–1.10)
GFR calc non Af Amer: >90 mL/min (ref >90)
Glucose, Bld: 115 mg/dL — ABNORMAL HIGH (ref 70–99)
Sodium: 132 mEq/L — ABNORMAL LOW (ref 135–145)

## 2013-08-30 MED ORDER — VANCOMYCIN HCL IN DEXTROSE 1-5 GM/200ML-% IV SOLN
1000.0000 mg | Freq: Once | INTRAVENOUS | Status: AC
  Administered 2013-08-30: 1000 mg via INTRAVENOUS
  Filled 2013-08-30: qty 200

## 2013-08-30 MED ORDER — SODIUM CHLORIDE 0.9 % IV SOLN: INTRAVENOUS | Status: DC

## 2013-08-30 MED ORDER — FENTANYL CITRATE 0.05 MG/ML IJ SOLN
50.0000 ug | INTRAMUSCULAR | Status: AC | PRN
  Administered 2013-08-30 – 2013-08-31 (×2): 50 ug via INTRAVENOUS
  Filled 2013-08-30 (×2): qty 2

## 2013-08-30 MED ORDER — FENTANYL CITRATE 0.05 MG/ML IJ SOLN
INTRAMUSCULAR | Status: AC
  Administered 2013-08-30: 21:00:00
  Filled 2013-08-30: qty 2

## 2013-08-30 MED ORDER — PROMETHAZINE HCL 12.5 MG PO TABS
ORAL_TABLET | ORAL | Status: AC
  Administered 2013-08-30: 25 mg
  Filled 2013-08-30: qty 2

## 2013-08-30 MED ORDER — DEXTROSE 5 % IV SOLN
2.0000 g | Freq: Once | INTRAVENOUS | Status: AC
  Administered 2013-08-30: 2 g via INTRAVENOUS

## 2013-08-30 MED ORDER — SODIUM CHLORIDE 0.9 % IV BOLUS (SEPSIS)
500.0000 mL | Freq: Once | INTRAVENOUS | Status: AC
  Administered 2013-08-30: 500 mL via INTRAVENOUS

## 2013-08-30 NOTE — ED Notes (Signed)
CRITICAL VALUE ALERT  Critical value received:   Wbc 0.2 and platelet15  Date of notification:  08/30/13  Time of notification:  2112  Nurse who received alert:  Bennetta Laos  MD notified (1st page):    Time of first page:  2112  MD notified (2nd page):  Time of second page:  Responding MD:  [email or web address]   Time MD responded:  2112
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
## 2013-08-30 NOTE — ED Notes (Signed)
Fever, onset today.  Finished chemo on Sunday

## 2013-09-04 LAB — CULTURE, BLOOD (ROUTINE X 2): Culture: NO GROWTH

## 2013-09-13 ENCOUNTER — Telehealth: Payer: Self-pay | Admitting: Obstetrics and Gynecology

## 2013-09-13 NOTE — Telephone Encounter (Signed)
Message left for pt to call back Review of last cbc shows severe anemia and thrombocytopenia , wbc 200. CBC    Component Value Date/Time   WBC 0.2* 08/30/2013 2034   RBC 2.34* 08/30/2013 2034   HGB 7.8* 08/30/2013 2034   HCT 22.0* 08/30/2013 2034   PLT 16* 08/30/2013 2034   MCV 94.0 08/30/2013 2034   MCH 33.3 08/30/2013 2034   MCHC 35.5 08/30/2013 2034   RDW 20.5* 08/30/2013 2034   LYMPHSABS TOO FEW TO COUNT, SMEAR AVAILABLE FOR REVIEW 08/30/2013 2034   MONOABS TOO FEW TO COUNT, SMEAR AVAILABLE FOR REVIEW 08/30/2013 2034   EOSABS TOO FEW TO COUNT, SMEAR AVAILABLE FOR REVIEW 08/30/2013 2034   BASOSABS TOO FEW TO COUNT, SMEAR AVAILABLE FOR REVIEW 08/30/2013 2034

## 2013-09-21 ENCOUNTER — Encounter (HOSPITAL_COMMUNITY): Payer: Self-pay | Admitting: Oncology

## 2013-09-21 ENCOUNTER — Other Ambulatory Visit (HOSPITAL_COMMUNITY): Payer: Self-pay | Admitting: Oncology

## 2013-09-21 DIAGNOSIS — C9201 Acute myeloblastic leukemia, in remission: Secondary | ICD-10-CM

## 2013-09-21 NOTE — Progress Notes (Signed)
Orders from [facility] entered into [facility].

## 2013-09-21 NOTE — Progress Notes (Unsigned)
[name] being seen at [facility] by provider to establish care so we may treat her on behalf of Dr. Lowell Guitar at [facility] accessed to obtain pertinent information for Dr. Bertrum Sol office visit.

## 2013-09-22 ENCOUNTER — Other Ambulatory Visit (HOSPITAL_COMMUNITY): Payer: Self-pay | Admitting: Hematology and Oncology

## 2013-09-22 ENCOUNTER — Encounter (HOSPITAL_COMMUNITY): Payer: Medicaid Other | Attending: Hematology and Oncology

## 2013-09-22 DIAGNOSIS — C9201 Acute myeloblastic leukemia, in remission: Secondary | ICD-10-CM | POA: Insufficient documentation

## 2013-09-22 DIAGNOSIS — C9590 Leukemia, unspecified not having achieved remission: Secondary | ICD-10-CM | POA: Insufficient documentation

## 2013-09-22 LAB — CBC WITH DIFFERENTIAL/PLATELET
Basophils Relative: 1 % (ref 0–1)
HCT: 29.3 % — ABNORMAL LOW (ref 36.0–46.0)
Hemoglobin: 10.4 g/dL — ABNORMAL LOW (ref 12.0–15.0)
Lymphocytes Relative: 32 % (ref 12–46)
Lymphs Abs: 0.6 10*3/uL — ABNORMAL LOW (ref 0.7–4.0)
MCHC: 35.5 g/dL (ref 30.0–36.0)
Monocytes Absolute: 0.4 10*3/uL (ref 0.1–1.0)
Monocytes Relative: 23 % — ABNORMAL HIGH (ref 3–12)
Neutro Abs: 0.8 10*3/uL — ABNORMAL LOW (ref 1.7–7.7)
Neutrophils Relative %: 45 % (ref 43–77)
RBC: 3.31 MIL/uL — ABNORMAL LOW (ref 3.87–5.11)
WBC: 1.8 10*3/uL — ABNORMAL LOW (ref 4.0–10.5)

## 2013-09-22 MED ORDER — HEPARIN SOD (PORK) LOCK FLUSH 100 UNIT/ML IV SOLN
500.0000 [IU] | Freq: Once | INTRAVENOUS | Status: AC
  Administered 2013-09-22: 500 [IU] via INTRAVENOUS

## 2013-09-22 MED ORDER — HEPARIN SOD (PORK) LOCK FLUSH 100 UNIT/ML IV SOLN
INTRAVENOUS | Status: AC
  Filled 2013-09-22: qty 5

## 2013-09-22 MED ORDER — SODIUM CHLORIDE 0.9 % IJ SOLN
10.0000 mL | INTRAMUSCULAR | Status: DC | PRN
  Administered 2013-09-22: 10 mL via INTRAVENOUS

## 2013-09-22 NOTE — Progress Notes (Signed)
Specimen obtained from left side of double port for labs (port is located right side of chest). Excellent blood return. Tolerated well.  Access left intact and dressed with tegaderm and hypafix tape for use tomorrow for transfusion.

## 2013-09-23 ENCOUNTER — Encounter (HOSPITAL_COMMUNITY): Payer: Self-pay

## 2013-09-23 ENCOUNTER — Encounter (HOSPITAL_BASED_OUTPATIENT_CLINIC_OR_DEPARTMENT_OTHER): Payer: Medicaid Other

## 2013-09-23 ENCOUNTER — Encounter (HOSPITAL_COMMUNITY): Payer: Medicaid Other

## 2013-09-23 VITALS — BP 93/60 | HR 114 | Temp 98.3°F | Resp 20 | Wt 138.9 lb

## 2013-09-23 DIAGNOSIS — I509 Heart failure, unspecified: Secondary | ICD-10-CM | POA: Insufficient documentation

## 2013-09-23 DIAGNOSIS — E039 Hypothyroidism, unspecified: Secondary | ICD-10-CM

## 2013-09-23 DIAGNOSIS — C9201 Acute myeloblastic leukemia, in remission: Secondary | ICD-10-CM

## 2013-09-23 MED ORDER — HEPARIN SOD (PORK) LOCK FLUSH 100 UNIT/ML IV SOLN
INTRAVENOUS | Status: AC
  Filled 2013-09-23: qty 5

## 2013-09-23 MED ORDER — SODIUM CHLORIDE 0.9 % IJ SOLN
10.0000 mL | INTRAMUSCULAR | Status: DC | PRN
  Administered 2013-09-23: 10 mL via INTRAVENOUS

## 2013-09-23 MED ORDER — HEPARIN SOD (PORK) LOCK FLUSH 100 UNIT/ML IV SOLN
500.0000 [IU] | Freq: Once | INTRAVENOUS | Status: AC
  Administered 2013-09-23: 500 [IU] via INTRAVENOUS

## 2013-09-23 NOTE — Progress Notes (Signed)
[name] presented for MD visit and Portacath de-accessed and flushed.  Proper placement of portacath confirmed by CXR (careeverywhere).  Portacath located right chest wall accessed with  H 20 needle.  No blood return and advised to follow-up with caregivers at [facility] flushed with 30ml NS and 500U/61ml Heparin and needle removed intact.  Procedure tolerated well and without incident.  No swelling, pain, redness noted.

## 2013-09-23 NOTE — Patient Instructions (Signed)
[facility] Discharge Instructions  RECOMMENDATIONS MADE BY THE CONSULTANT AND ANY TEST RESULTS WILL BE SENT TO YOUR REFERRING PHYSICIAN.  EXAM FINDINGS BY THE PHYSICIAN TODAY AND SIGNS OR SYMPTOMS TO REPORT TO CLINIC OR PRIMARY PHYSICIAN: Exam and findings as discussed by Dr. Zigmund Daniel.  INSTRUCTIONS/FOLLOW-UP: 1.  We will continue to provide your supplementary care while under Dr. Roanna Banning care - please return in 4 weeks to see Dr. Zigmund Daniel again as scheduled.  Thank you for choosing [facility] to provide your oncology and hematology care.  To afford each patient quality time with our providers, please arrive at least 15 minutes before your scheduled appointment time.  With your help, our goal is to use those 15 minutes to complete the necessary work-up to ensure our physicians have the information they need to help with your evaluation and healthcare recommendations.    Effective January 1st, 2014, we ask that you re-schedule your appointment with our physicians should you arrive 10 or more minutes late for your appointment.  We strive to give you quality time with our providers, and arriving late affects you and other patients whose appointments are after yours.    Again, thank you for choosing [facility].  Our hope is that these requests will decrease the amount of time that you wait before being seen by our physicians.       _____________________________________________________________  Should you have questions after your visit to [facility], please contact our office at [phone] between the hours of 8:30 a.m. and 5:00 p.m.  Voicemails left after 4:30 p.m. will not be returned until the following business day.  For prescription refill requests, have your pharmacy contact our office with your prescription refill request.
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
## 2013-09-25 ENCOUNTER — Encounter (HOSPITAL_COMMUNITY): Payer: Self-pay | Admitting: Emergency Medicine

## 2013-09-25 ENCOUNTER — Emergency Department (HOSPITAL_COMMUNITY)
Admission: EM | Admit: 2013-09-25 | Discharge: 2013-09-25 | Disposition: A | Discharge status: Home / Self Care | Payer: Medicaid Other | Attending: Emergency Medicine | Admitting: Emergency Medicine

## 2013-09-25 DIAGNOSIS — Z8781 Personal history of (healed) traumatic fracture: Secondary | ICD-10-CM | POA: Insufficient documentation

## 2013-09-25 DIAGNOSIS — F17200 Nicotine dependence, unspecified, uncomplicated: Secondary | ICD-10-CM | POA: Insufficient documentation

## 2013-09-25 DIAGNOSIS — C9590 Leukemia, unspecified not having achieved remission: Secondary | ICD-10-CM

## 2013-09-25 DIAGNOSIS — Z79899 Other long term (current) drug therapy: Secondary | ICD-10-CM | POA: Insufficient documentation

## 2013-09-25 DIAGNOSIS — Z95810 Presence of automatic (implantable) cardiac defibrillator: Secondary | ICD-10-CM | POA: Insufficient documentation

## 2013-09-25 DIAGNOSIS — M129 Arthropathy, unspecified: Secondary | ICD-10-CM | POA: Insufficient documentation

## 2013-09-25 DIAGNOSIS — R52 Pain, unspecified: Secondary | ICD-10-CM

## 2013-09-25 DIAGNOSIS — F411 Generalized anxiety disorder: Secondary | ICD-10-CM | POA: Insufficient documentation

## 2013-09-25 DIAGNOSIS — G8929 Other chronic pain: Secondary | ICD-10-CM | POA: Insufficient documentation

## 2013-09-25 DIAGNOSIS — I509 Heart failure, unspecified: Secondary | ICD-10-CM | POA: Insufficient documentation

## 2013-09-25 DIAGNOSIS — E039 Hypothyroidism, unspecified: Secondary | ICD-10-CM | POA: Insufficient documentation

## 2013-09-25 DIAGNOSIS — C9100 Acute lymphoblastic leukemia not having achieved remission: Secondary | ICD-10-CM | POA: Insufficient documentation

## 2013-09-25 DIAGNOSIS — Z760 Encounter for issue of repeat prescription: Secondary | ICD-10-CM | POA: Insufficient documentation

## 2013-09-25 MED ORDER — HYDROMORPHONE HCL PF 2 MG/ML IJ SOLN
2.0000 mg | Freq: Once | INTRAMUSCULAR | Status: AC
  Administered 2013-09-25: 2 mg via INTRAMUSCULAR
  Filled 2013-09-25: qty 1

## 2013-09-25 MED ORDER — HYDROMORPHONE HCL 4 MG PO TABS: 4.0000 mg | ORAL_TABLET | Freq: Four times a day (QID) | ORAL | Status: AC | PRN
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
## 2013-09-25 NOTE — ED Notes (Signed)
Pt c/o generalized pain. Currently being treated for lukemia-appointment at [facility] 09/29/13.

## 2013-09-26 LAB — TYPE AND SCREEN
ABO/RH(D): B NEG
Antibody Screen: NEGATIVE
Unit division: 0
Unit division: 0

## 2013-09-27 ENCOUNTER — Encounter (HOSPITAL_BASED_OUTPATIENT_CLINIC_OR_DEPARTMENT_OTHER): Payer: Medicaid Other

## 2013-09-27 ENCOUNTER — Telehealth (HOSPITAL_COMMUNITY): Payer: Self-pay | Admitting: Oncology

## 2013-09-27 ENCOUNTER — Other Ambulatory Visit (HOSPITAL_COMMUNITY): Payer: Self-pay | Admitting: Oncology

## 2013-09-27 DIAGNOSIS — C9590 Leukemia, unspecified not having achieved remission: Secondary | ICD-10-CM

## 2013-09-27 DIAGNOSIS — C9201 Acute myeloblastic leukemia, in remission: Secondary | ICD-10-CM

## 2013-09-27 DIAGNOSIS — Z452 Encounter for adjustment and management of vascular access device: Secondary | ICD-10-CM

## 2013-09-27 LAB — CBC WITH DIFFERENTIAL/PLATELET
Basophils Absolute: 0 10*3/uL (ref 0.0–0.1)
Eosinophils Absolute: 0 10*3/uL (ref 0.0–0.7)
Eosinophils Relative: 2 % (ref 0–5)
HCT: 26 % — ABNORMAL LOW (ref 36.0–46.0)
Lymphocytes Relative: 21 % (ref 12–46)
Lymphs Abs: 0.5 10*3/uL — ABNORMAL LOW (ref 0.7–4.0)
MCV: 93.9 fL (ref 78.0–100.0)
Neutro Abs: 1.3 10*3/uL — ABNORMAL LOW (ref 1.7–7.7)
Neutrophils Relative %: 60 % (ref 43–77)
Platelets: 59 10*3/uL — ABNORMAL LOW (ref 150–400)
RBC: 2.77 MIL/uL — ABNORMAL LOW (ref 3.87–5.11)
RDW: 18.8 % — ABNORMAL HIGH (ref 11.5–15.5)
WBC: 2.1 10*3/uL — ABNORMAL LOW (ref 4.0–10.5)

## 2013-09-27 LAB — COMPREHENSIVE METABOLIC PANEL
ALT: 13 U/L (ref 0–35)
AST: 26 U/L (ref 0–37)
Albumin: 3.3 g/dL — ABNORMAL LOW (ref 3.5–5.2)
Alkaline Phosphatase: 94 U/L (ref 39–117)
BUN: 9 mg/dL (ref 6–23)
Calcium: 9.7 mg/dL (ref 8.4–10.5)
Chloride: 101 mEq/L (ref 96–112)
Creatinine, Ser: 0.58 mg/dL (ref 0.50–1.10)
Potassium: 4.2 mEq/L (ref 3.5–5.1)
Sodium: 139 mEq/L (ref 135–145)
Total Bilirubin: 0.5 mg/dL (ref 0.3–1.2)
Total Protein: 6.7 g/dL (ref 6.0–8.3)

## 2013-09-27 MED ORDER — SODIUM CHLORIDE 0.9 % IJ SOLN
10.0000 mL | INTRAMUSCULAR | Status: DC | PRN
  Administered 2013-09-27: 10 mL via INTRAVENOUS

## 2013-09-27 MED ORDER — HEPARIN SOD (PORK) LOCK FLUSH 100 UNIT/ML IV SOLN
500.0000 [IU] | Freq: Once | INTRAVENOUS | Status: AC
  Administered 2013-09-27: 500 [IU] via INTRAVENOUS

## 2013-09-27 MED ORDER — HEPARIN SOD (PORK) LOCK FLUSH 100 UNIT/ML IV SOLN
INTRAVENOUS | Status: AC
  Filled 2013-09-27: qty 5

## 2013-09-27 NOTE — Progress Notes (Signed)
[name] presented for Portacath access and flush. Proper placement of portacath confirmed by CXR. Double lumen Portacath located right chest wall accessed pt's right side of port with  H 20 needle. Good blood return present. Portacath flushed with 20ml NS and 500U/42ml Heparin and needle removed intact. Procedure without incident. Patient tolerated procedure well.

## 2013-09-27 NOTE — Telephone Encounter (Signed)
Called Dr. Roanna Banning office around 11am and again around 4:30pm to obtain patient's orders via fax to [facility] fax [phone].  On both occasions the RN informed that the faxes were sent - not received as of 1653.  Message left with Dwana Curd to call me at my office # and cell # to be informed of her hgb and since she is at [facility] all day tomorrow, she potentially will need transfusion of PRBCs from them.  Labs previously faxed to [facility] as well.  Awaiting return call from patient.

## 2013-09-29 ENCOUNTER — Other Ambulatory Visit: Payer: Self-pay | Admitting: Obstetrics and Gynecology

## 2013-09-29 ENCOUNTER — Telehealth: Payer: Self-pay | Admitting: Obstetrics and Gynecology

## 2013-09-29 MED ORDER — CLONAZEPAM 0.5 MG PO TABS: 0.5000 mg | ORAL_TABLET | Freq: Two times a day (BID) | ORAL | Status: DC

## 2013-09-30 ENCOUNTER — Encounter (HOSPITAL_BASED_OUTPATIENT_CLINIC_OR_DEPARTMENT_OTHER): Payer: Medicaid Other

## 2013-09-30 DIAGNOSIS — Z452 Encounter for adjustment and management of vascular access device: Secondary | ICD-10-CM

## 2013-09-30 DIAGNOSIS — C9201 Acute myeloblastic leukemia, in remission: Secondary | ICD-10-CM

## 2013-09-30 LAB — CBC WITH DIFFERENTIAL/PLATELET
Basophils Absolute: 0 10*3/uL (ref 0.0–0.1)
Basophils Relative: 0 % (ref 0–1)
Eosinophils Absolute: 0.1 10*3/uL (ref 0.0–0.7)
Eosinophils Relative: 2 % (ref 0–5)
HCT: 32.6 % — ABNORMAL LOW (ref 36.0–46.0)
Hemoglobin: 11 g/dL — ABNORMAL LOW (ref 12.0–15.0)
MCH: 32.4 pg (ref 26.0–34.0)
MCHC: 33.7 g/dL (ref 30.0–36.0)
MCV: 95.9 fL (ref 78.0–100.0)
Monocytes Absolute: 0.5 10*3/uL (ref 0.1–1.0)
Monocytes Relative: 21 % — ABNORMAL HIGH (ref 3–12)
Neutro Abs: 1.4 10*3/uL — ABNORMAL LOW (ref 1.7–7.7)
Platelets: 78 10*3/uL — ABNORMAL LOW (ref 150–400)
RDW: 19.3 % — ABNORMAL HIGH (ref 11.5–15.5)
WBC: 2.4 10*3/uL — ABNORMAL LOW (ref 4.0–10.5)

## 2013-09-30 MED ORDER — SODIUM CHLORIDE 0.9 % IJ SOLN
10.0000 mL | INTRAMUSCULAR | Status: DC | PRN
  Administered 2013-09-30: 10 mL via INTRAVENOUS

## 2013-09-30 MED ORDER — HEPARIN SOD (PORK) LOCK FLUSH 100 UNIT/ML IV SOLN
INTRAVENOUS | Status: AC
  Filled 2013-09-30: qty 5

## 2013-09-30 MED ORDER — HEPARIN SOD (PORK) LOCK FLUSH 100 UNIT/ML IV SOLN
500.0000 [IU] | Freq: Once | INTRAVENOUS | Status: AC
  Administered 2013-09-30: 500 [IU] via INTRAVENOUS

## 2013-09-30 NOTE — Telephone Encounter (Signed)
Pt aware to pick up Rx

## 2013-09-30 NOTE — Progress Notes (Signed)
[name] presented for Portacath access and flush. Proper placement of portacath confirmed by CXR. Double lumen Portacath located right chest wall accessed pt's left side of port with  H 20 needle. Good blood return present. Portacath flushed with 20ml NS and 500U/60ml Heparin and needle removed intact. Procedure without incident. Patient tolerated procedure well.

## 2013-09-30 NOTE — Progress Notes (Signed)
Tammy, RN at [facility] contacted to fax over Beverly A Walkup's orders - have not received orders from requests placed on 09/27/13.  Awaiting fax at present.

## 2013-10-04 ENCOUNTER — Encounter (HOSPITAL_BASED_OUTPATIENT_CLINIC_OR_DEPARTMENT_OTHER): Payer: Medicaid Other

## 2013-10-04 DIAGNOSIS — C9201 Acute myeloblastic leukemia, in remission: Secondary | ICD-10-CM

## 2013-10-04 DIAGNOSIS — Z452 Encounter for adjustment and management of vascular access device: Secondary | ICD-10-CM

## 2013-10-04 LAB — COMPREHENSIVE METABOLIC PANEL
ALT: 14 U/L (ref 0–35)
Albumin: 3.5 g/dL (ref 3.5–5.2)
Alkaline Phosphatase: 106 U/L (ref 39–117)
BUN: 9 mg/dL (ref 6–23)
CO2: 26 mEq/L (ref 19–32)
Chloride: 102 mEq/L (ref 96–112)
Creatinine, Ser: 0.59 mg/dL (ref 0.50–1.10)
GFR calc non Af Amer: >90 mL/min (ref >90)
Glucose, Bld: 106 mg/dL — ABNORMAL HIGH (ref 70–99)
Potassium: 4.3 mEq/L (ref 3.5–5.1)
Sodium: 137 mEq/L (ref 135–145)
Total Bilirubin: 0.4 mg/dL (ref 0.3–1.2)
Total Protein: 6.5 g/dL (ref 6.0–8.3)

## 2013-10-04 LAB — CBC WITH DIFFERENTIAL/PLATELET
Eosinophils Absolute: 0.1 10*3/uL (ref 0.0–0.7)
Eosinophils Relative: 2 % (ref 0–5)
Hemoglobin: 11.4 g/dL — ABNORMAL LOW (ref 12.0–15.0)
Lymphocytes Relative: 13 % (ref 12–46)
Lymphs Abs: 0.6 10*3/uL — ABNORMAL LOW (ref 0.7–4.0)
MCH: 33.5 pg (ref 26.0–34.0)
MCHC: 34.4 g/dL (ref 30.0–36.0)
Monocytes Relative: 14 % — ABNORMAL HIGH (ref 3–12)
Neutro Abs: 3.2 10*3/uL (ref 1.7–7.7)
Neutrophils Relative %: 71 % (ref 43–77)
Platelets: 103 10*3/uL — ABNORMAL LOW (ref 150–400)
RBC: 3.4 MIL/uL — ABNORMAL LOW (ref 3.87–5.11)
RDW: 20.4 % — ABNORMAL HIGH (ref 11.5–15.5)
WBC: 4.5 10*3/uL (ref 4.0–10.5)

## 2013-10-04 MED ORDER — HEPARIN SOD (PORK) LOCK FLUSH 100 UNIT/ML IV SOLN
INTRAVENOUS | Status: AC
  Filled 2013-10-04: qty 5

## 2013-10-04 MED ORDER — HEPARIN SOD (PORK) LOCK FLUSH 100 UNIT/ML IV SOLN
500.0000 [IU] | Freq: Once | INTRAVENOUS | Status: AC
  Administered 2013-10-04: 500 [IU] via INTRAVENOUS

## 2013-10-04 MED ORDER — SODIUM CHLORIDE 0.9 % IJ SOLN
10.0000 mL | INTRAMUSCULAR | Status: DC | PRN
  Administered 2013-10-04: 10 mL via INTRAVENOUS

## 2013-10-04 NOTE — Progress Notes (Signed)
Dwana Curd presented for Portacath access and flush.  Double Portacath located rt chest wall accessed lateral side with  H 20 needle. Sluggish blood return and flushes easily. Portacath flushed with 20ml NS and 500U/57ml Heparin and needle removed intact. Procedure without incident. Patient tolerated procedure well.

## 2013-10-07 ENCOUNTER — Encounter (HOSPITAL_COMMUNITY): Payer: Self-pay | Admitting: Emergency Medicine

## 2013-10-07 ENCOUNTER — Emergency Department (HOSPITAL_COMMUNITY)
Admission: EM | Admit: 2013-10-07 | Discharge: 2013-10-07 | Disposition: A | Discharge status: Home / Self Care | Payer: Medicaid Other | Attending: Emergency Medicine | Admitting: Emergency Medicine

## 2013-10-07 ENCOUNTER — Encounter (HOSPITAL_BASED_OUTPATIENT_CLINIC_OR_DEPARTMENT_OTHER): Payer: Medicaid Other

## 2013-10-07 DIAGNOSIS — C9201 Acute myeloblastic leukemia, in remission: Secondary | ICD-10-CM

## 2013-10-07 DIAGNOSIS — F411 Generalized anxiety disorder: Secondary | ICD-10-CM | POA: Insufficient documentation

## 2013-10-07 DIAGNOSIS — K5900 Constipation, unspecified: Secondary | ICD-10-CM | POA: Insufficient documentation

## 2013-10-07 DIAGNOSIS — F17200 Nicotine dependence, unspecified, uncomplicated: Secondary | ICD-10-CM | POA: Insufficient documentation

## 2013-10-07 DIAGNOSIS — F1110 Opioid abuse, uncomplicated: Secondary | ICD-10-CM

## 2013-10-07 DIAGNOSIS — Z8739 Personal history of other diseases of the musculoskeletal system and connective tissue: Secondary | ICD-10-CM | POA: Insufficient documentation

## 2013-10-07 DIAGNOSIS — C9590 Leukemia, unspecified not having achieved remission: Secondary | ICD-10-CM | POA: Insufficient documentation

## 2013-10-07 DIAGNOSIS — F1120 Opioid dependence, uncomplicated: Secondary | ICD-10-CM | POA: Insufficient documentation

## 2013-10-07 DIAGNOSIS — G8929 Other chronic pain: Secondary | ICD-10-CM

## 2013-10-07 DIAGNOSIS — E039 Hypothyroidism, unspecified: Secondary | ICD-10-CM | POA: Insufficient documentation

## 2013-10-07 DIAGNOSIS — G43909 Migraine, unspecified, not intractable, without status migrainosus: Secondary | ICD-10-CM | POA: Insufficient documentation

## 2013-10-07 DIAGNOSIS — Z452 Encounter for adjustment and management of vascular access device: Secondary | ICD-10-CM

## 2013-10-07 DIAGNOSIS — I509 Heart failure, unspecified: Secondary | ICD-10-CM | POA: Insufficient documentation

## 2013-10-07 LAB — CBC WITH DIFFERENTIAL/PLATELET
Eosinophils Relative: 2 % (ref 0–5)
HCT: 33.9 % — ABNORMAL LOW (ref 36.0–46.0)
Lymphocytes Relative: 15 % (ref 12–46)
Lymphs Abs: 0.6 10*3/uL — ABNORMAL LOW (ref 0.7–4.0)
MCV: 97.7 fL (ref 78.0–100.0)
Monocytes Absolute: 0.3 10*3/uL (ref 0.1–1.0)
Neutro Abs: 2.9 10*3/uL (ref 1.7–7.7)
Platelets: 140 10*3/uL — ABNORMAL LOW (ref 150–400)
RBC: 3.47 MIL/uL — ABNORMAL LOW (ref 3.87–5.11)
WBC: 3.8 10*3/uL — ABNORMAL LOW (ref 4.0–10.5)

## 2013-10-07 MED ORDER — HEPARIN SOD (PORK) LOCK FLUSH 100 UNIT/ML IV SOLN
INTRAVENOUS | Status: AC
  Filled 2013-10-07: qty 5

## 2013-10-07 MED ORDER — HYDROMORPHONE HCL 2 MG PO TABS
2.0000 mg | ORAL_TABLET | Freq: Once | ORAL | Status: AC
Start: 2013-10-07 — End: 2013-10-07
  Administered 2013-10-07: 2 mg via ORAL
  Filled 2013-10-07: qty 1

## 2013-10-07 MED ORDER — SODIUM CHLORIDE 0.9 % IJ SOLN
10.0000 mL | INTRAMUSCULAR | Status: DC | PRN
  Administered 2013-10-07: 10 mL via INTRAVENOUS

## 2013-10-07 MED ORDER — HEPARIN SOD (PORK) LOCK FLUSH 100 UNIT/ML IV SOLN
500.0000 [IU] | Freq: Once | INTRAVENOUS | Status: AC
  Administered 2013-10-07: 500 [IU] via INTRAVENOUS

## 2013-10-07 MED ORDER — TRAMADOL HCL 50 MG PO TABS: 50.0000 mg | ORAL_TABLET | Freq: Four times a day (QID) | ORAL | Status: DC | PRN

## 2013-10-07 NOTE — ED Notes (Addendum)
Patient requesting medication refill for Dilaudid 2mg  PO. Per patient was prescribed Dilaudid by [facility] for pain from leukemia. Per patient last dose was at 6pm yesterday.

## 2013-10-07 NOTE — Progress Notes (Signed)
[name] presented for Portacath access and flush. Portacath located rt chest wall accessed with  H 20 needle. Good blood return present. Portacath flushed with 20ml NS and 500U/44ml Heparin and needle removed intact. Procedure without incident. Patient tolerated procedure well.
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
## 2013-10-11 ENCOUNTER — Encounter (HOSPITAL_BASED_OUTPATIENT_CLINIC_OR_DEPARTMENT_OTHER): Payer: Medicaid Other

## 2013-10-11 DIAGNOSIS — C9590 Leukemia, unspecified not having achieved remission: Secondary | ICD-10-CM

## 2013-10-11 DIAGNOSIS — C9201 Acute myeloblastic leukemia, in remission: Secondary | ICD-10-CM

## 2013-10-11 DIAGNOSIS — Z452 Encounter for adjustment and management of vascular access device: Secondary | ICD-10-CM

## 2013-10-11 LAB — COMPREHENSIVE METABOLIC PANEL
ALT: 17 U/L (ref 0–35)
AST: 23 U/L (ref 0–37)
Albumin: 4 g/dL (ref 3.5–5.2)
Alkaline Phosphatase: 99 U/L (ref 39–117)
CO2: 26 mEq/L (ref 19–32)
Calcium: 9.4 mg/dL (ref 8.4–10.5)
Chloride: 101 mEq/L (ref 96–112)
GFR calc Af Amer: >90 mL/min (ref >90)
GFR calc non Af Amer: >90 mL/min (ref >90)
Glucose, Bld: 108 mg/dL — ABNORMAL HIGH (ref 70–99)
Potassium: 4.2 mEq/L (ref 3.5–5.1)
Sodium: 139 mEq/L (ref 135–145)
Total Bilirubin: 0.5 mg/dL (ref 0.3–1.2)

## 2013-10-11 LAB — CBC WITH DIFFERENTIAL/PLATELET
Basophils Relative: 0 % (ref 0–1)
Eosinophils Absolute: 0.1 10*3/uL (ref 0.0–0.7)
MCH: 34.1 pg — ABNORMAL HIGH (ref 26.0–34.0)
MCHC: 34.2 g/dL (ref 30.0–36.0)
MCV: 99.5 fL (ref 78.0–100.0)
Monocytes Relative: 8 % (ref 3–12)
Neutro Abs: 3.5 10*3/uL (ref 1.7–7.7)
Platelets: 168 10*3/uL (ref 150–400)
RDW: 21.6 % — ABNORMAL HIGH (ref 11.5–15.5)
WBC: 4.9 10*3/uL (ref 4.0–10.5)

## 2013-10-11 MED ORDER — SODIUM CHLORIDE 0.9 % IJ SOLN
10.0000 mL | INTRAMUSCULAR | Status: DC | PRN
  Administered 2013-10-11: 40 mL via INTRAVENOUS

## 2013-10-11 MED ORDER — HEPARIN SOD (PORK) LOCK FLUSH 100 UNIT/ML IV SOLN
INTRAVENOUS | Status: AC
  Filled 2013-10-11: qty 5

## 2013-10-11 MED ORDER — HEPARIN SOD (PORK) LOCK FLUSH 100 UNIT/ML IV SOLN
500.0000 [IU] | Freq: Once | INTRAVENOUS | Status: AC
  Administered 2013-10-11: 1000 [IU] via INTRAVENOUS

## 2013-10-11 NOTE — Progress Notes (Signed)
Has double port. Both sides flushed with 20 ml ns and heparin 500 units both sides.  Good blood return from both sides.  Tolerated well.

## 2013-10-13 ENCOUNTER — Encounter (HOSPITAL_COMMUNITY): Payer: Self-pay | Admitting: Emergency Medicine

## 2013-10-13 ENCOUNTER — Emergency Department (HOSPITAL_COMMUNITY)
Admission: EM | Admit: 2013-10-13 | Discharge: 2013-10-13 | Disposition: A | Discharge status: Home / Self Care | Payer: Medicaid Other | Attending: Emergency Medicine | Admitting: Emergency Medicine

## 2013-10-13 DIAGNOSIS — F17200 Nicotine dependence, unspecified, uncomplicated: Secondary | ICD-10-CM | POA: Insufficient documentation

## 2013-10-13 DIAGNOSIS — F411 Generalized anxiety disorder: Secondary | ICD-10-CM | POA: Insufficient documentation

## 2013-10-13 DIAGNOSIS — Z8739 Personal history of other diseases of the musculoskeletal system and connective tissue: Secondary | ICD-10-CM | POA: Insufficient documentation

## 2013-10-13 DIAGNOSIS — Z79899 Other long term (current) drug therapy: Secondary | ICD-10-CM | POA: Insufficient documentation

## 2013-10-13 DIAGNOSIS — IMO0001 Reserved for inherently not codable concepts without codable children: Secondary | ICD-10-CM | POA: Insufficient documentation

## 2013-10-13 DIAGNOSIS — I509 Heart failure, unspecified: Secondary | ICD-10-CM | POA: Insufficient documentation

## 2013-10-13 DIAGNOSIS — R52 Pain, unspecified: Secondary | ICD-10-CM

## 2013-10-13 DIAGNOSIS — E039 Hypothyroidism, unspecified: Secondary | ICD-10-CM | POA: Insufficient documentation

## 2013-10-13 DIAGNOSIS — Z8781 Personal history of (healed) traumatic fracture: Secondary | ICD-10-CM | POA: Insufficient documentation

## 2013-10-13 DIAGNOSIS — G8929 Other chronic pain: Secondary | ICD-10-CM | POA: Insufficient documentation

## 2013-10-13 MED ORDER — HYDROMORPHONE HCL 4 MG PO TABS: 4.0000 mg | ORAL_TABLET | Freq: Four times a day (QID) | ORAL | Status: DC | PRN

## 2013-10-13 MED ORDER — HYDROMORPHONE HCL PF 2 MG/ML IJ SOLN
2.0000 mg | Freq: Once | INTRAMUSCULAR | Status: AC
  Administered 2013-10-13: 2 mg via INTRAMUSCULAR
  Filled 2013-10-13: qty 1

## 2013-10-13 NOTE — ED Notes (Signed)
Generalized weakness and generalized pain.  Out of pain meds.  Last dose last night.  Denies fevers.
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
## 2013-10-25 ENCOUNTER — Ambulatory Visit (HOSPITAL_COMMUNITY): Payer: Medicaid Other

## 2013-10-26 ENCOUNTER — Encounter (HOSPITAL_COMMUNITY): Payer: Self-pay

## 2013-11-03 ENCOUNTER — Encounter (HOSPITAL_COMMUNITY): Payer: Self-pay | Admitting: Emergency Medicine

## 2013-11-03 ENCOUNTER — Emergency Department (HOSPITAL_COMMUNITY): Payer: Medicaid Other

## 2013-11-03 ENCOUNTER — Emergency Department (HOSPITAL_COMMUNITY)
Admission: EM | Admit: 2013-11-03 | Discharge: 2013-11-03 | Disposition: A | Discharge status: Other Acute Inpatient Hospital | Payer: Medicaid Other | Attending: Internal Medicine | Admitting: Internal Medicine

## 2013-11-03 DIAGNOSIS — Z8781 Personal history of (healed) traumatic fracture: Secondary | ICD-10-CM | POA: Insufficient documentation

## 2013-11-03 DIAGNOSIS — Z79899 Other long term (current) drug therapy: Secondary | ICD-10-CM | POA: Insufficient documentation

## 2013-11-03 DIAGNOSIS — F411 Generalized anxiety disorder: Secondary | ICD-10-CM | POA: Insufficient documentation

## 2013-11-03 DIAGNOSIS — E039 Hypothyroidism, unspecified: Secondary | ICD-10-CM | POA: Diagnosis not present

## 2013-11-03 DIAGNOSIS — C9200 Acute myeloblastic leukemia, not having achieved remission: Secondary | ICD-10-CM

## 2013-11-03 DIAGNOSIS — R509 Fever, unspecified: Secondary | ICD-10-CM | POA: Diagnosis present

## 2013-11-03 DIAGNOSIS — M129 Arthropathy, unspecified: Secondary | ICD-10-CM | POA: Insufficient documentation

## 2013-11-03 DIAGNOSIS — D709 Neutropenia, unspecified: Secondary | ICD-10-CM | POA: Insufficient documentation

## 2013-11-03 DIAGNOSIS — I509 Heart failure, unspecified: Secondary | ICD-10-CM | POA: Diagnosis not present

## 2013-11-03 DIAGNOSIS — G8929 Other chronic pain: Secondary | ICD-10-CM | POA: Insufficient documentation

## 2013-11-03 DIAGNOSIS — G43909 Migraine, unspecified, not intractable, without status migrainosus: Secondary | ICD-10-CM | POA: Insufficient documentation

## 2013-11-03 DIAGNOSIS — F17200 Nicotine dependence, unspecified, uncomplicated: Secondary | ICD-10-CM | POA: Diagnosis not present

## 2013-11-03 DIAGNOSIS — R252 Cramp and spasm: Secondary | ICD-10-CM | POA: Insufficient documentation

## 2013-11-03 DIAGNOSIS — D696 Thrombocytopenia, unspecified: Secondary | ICD-10-CM | POA: Diagnosis not present

## 2013-11-03 DIAGNOSIS — R5081 Fever presenting with conditions classified elsewhere: Secondary | ICD-10-CM | POA: Insufficient documentation

## 2013-11-03 LAB — URINALYSIS W MICROSCOPIC + REFLEX CULTURE
Bilirubin Urine: NEGATIVE
Glucose, UA: NEGATIVE mg/dL
Ketones, ur: NEGATIVE mg/dL
LEUKOCYTES UA: NEGATIVE
Nitrite: NEGATIVE
Protein, ur: NEGATIVE mg/dL
Specific Gravity, Urine: 1.01 (ref 1.005–1.030)
UROBILINOGEN UA: 0.2 mg/dL (ref 0.0–1.0)
pH: 7.5 (ref 5.0–8.0)

## 2013-11-03 LAB — CBC WITH DIFFERENTIAL/PLATELET
BASOS ABS: 0 10*3/uL (ref 0.0–0.1)
BASOS PCT: 0 % (ref 0–1)
Eosinophils Absolute: 0 10*3/uL (ref 0.0–0.7)
Eosinophils Relative: 0 % (ref 0–5)
HCT: 24.6 % — ABNORMAL LOW (ref 36.0–46.0)
HEMOGLOBIN: 9.1 g/dL — AB (ref 12.0–15.0)
Lymphocytes Relative: 94 % — ABNORMAL HIGH (ref 12–46)
Lymphs Abs: 0.2 10*3/uL — ABNORMAL LOW (ref 0.7–4.0)
MCH: 31 pg (ref 26.0–34.0)
MCHC: 37 g/dL — AB (ref 30.0–36.0)
MCV: 83.7 fL (ref 78.0–100.0)
MONOS PCT: 0 % — AB (ref 3–12)
Monocytes Absolute: 0 10*3/uL — ABNORMAL LOW (ref 0.1–1.0)
NEUTROS ABS: 0 10*3/uL — AB (ref 1.7–7.7)
NEUTROS PCT: 6 % — AB (ref 43–77)
RBC: 2.94 MIL/uL — ABNORMAL LOW (ref 3.87–5.11)
RDW: 17.8 % — AB (ref 11.5–15.5)
SMEAR REVIEW: DECREASED
WBC: 0.2 10*3/uL — CL (ref 4.0–10.5)

## 2013-11-03 LAB — LACTIC ACID, PLASMA: LACTIC ACID, VENOUS: 1.3 mmol/L (ref 0.5–2.2)

## 2013-11-03 LAB — BASIC METABOLIC PANEL
BUN: 17 mg/dL (ref 6–23)
CO2: 26 mEq/L (ref 19–32)
Calcium: 9 mg/dL (ref 8.4–10.5)
Chloride: 99 mEq/L (ref 96–112)
Creatinine, Ser: 0.76 mg/dL (ref 0.50–1.10)
Glucose, Bld: 107 mg/dL — ABNORMAL HIGH (ref 70–99)
Potassium: 4 mEq/L (ref 3.7–5.3)
SODIUM: 138 meq/L (ref 137–147)

## 2013-11-03 MED ORDER — HYDROMORPHONE HCL PF 2 MG/ML IJ SOLN
INTRAMUSCULAR | Status: AC
  Filled 2013-11-03: qty 1

## 2013-11-03 MED ORDER — FENTANYL CITRATE 0.05 MG/ML IJ SOLN
100.0000 ug | INTRAMUSCULAR | Status: DC | PRN
  Administered 2013-11-03: 100 ug via INTRAVENOUS
  Filled 2013-11-03: qty 2

## 2013-11-03 MED ORDER — SODIUM CHLORIDE 0.9 % IV SOLN
INTRAVENOUS | Status: DC
  Administered 2013-11-03: 05:00:00 via INTRAVENOUS

## 2013-11-03 MED ORDER — ACETAMINOPHEN 500 MG PO TABS
1000.0000 mg | ORAL_TABLET | Freq: Once | ORAL | Status: AC
  Administered 2013-11-03: 1000 mg via ORAL
  Filled 2013-11-03: qty 2

## 2013-11-03 MED ORDER — DEXTROSE 5 % IV SOLN
2.0000 g | Freq: Once | INTRAVENOUS | Status: AC
  Administered 2013-11-03: 2 g via INTRAVENOUS

## 2013-11-03 MED ORDER — HYDROMORPHONE HCL PF 2 MG/ML IJ SOLN
2.0000 mg | Freq: Once | INTRAMUSCULAR | Status: AC
  Administered 2013-11-03: 2 mg via INTRAVENOUS
  Filled 2013-11-03: qty 1

## 2013-11-03 MED ORDER — HYDROMORPHONE HCL PF 2 MG/ML IJ SOLN
2.0000 mg | Freq: Once | INTRAMUSCULAR | Status: AC
  Administered 2013-11-03: 2 mg via INTRAVENOUS
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
## 2013-11-03 NOTE — ED Notes (Signed)
Patient requesting more pain medication. RN informed patient she could have more pain medication at 0745.

## 2013-11-03 NOTE — ED Notes (Signed)
[facility] at bedside 

## 2013-11-03 NOTE — ED Notes (Signed)
Patient left ED at this time with [facility] for transport to [facility]. No distress upon departure.

## 2013-11-03 NOTE — ED Notes (Signed)
Patient eating ice chips

## 2013-11-03 NOTE — ED Notes (Signed)
CRITICAL VALUE ALERT  Critical value received: wbc 0.2   And platelet<5000  Date of notification:  11/03/2013  Time of notification:  *0555  Critical value read back: yes  Nurse who received alert:  Bobbye Morton  MD notified (1st page):    Time of first page:    MD notified (2nd page):  Time of second page:  Responding MD:  [email or web address]  Time MD responded:  [phone]

## 2013-11-08 LAB — CULTURE, BLOOD (ROUTINE X 2)
Culture: NO GROWTH
Culture: NO GROWTH

## 2014-03-09 ENCOUNTER — Other Ambulatory Visit (HOSPITAL_COMMUNITY): Payer: Self-pay | Admitting: *Deleted

## 2014-03-09 DIAGNOSIS — C9590 Leukemia, unspecified not having achieved remission: Secondary | ICD-10-CM

## 2014-03-11 ENCOUNTER — Other Ambulatory Visit (HOSPITAL_COMMUNITY): Payer: Medicaid Other

## 2014-03-11 ENCOUNTER — Encounter (HOSPITAL_COMMUNITY): Payer: Medicaid Other | Attending: Hematology and Oncology

## 2014-03-11 DIAGNOSIS — C9590 Leukemia, unspecified not having achieved remission: Secondary | ICD-10-CM

## 2014-03-11 DIAGNOSIS — C9201 Acute myeloblastic leukemia, in remission: Secondary | ICD-10-CM

## 2014-03-11 DIAGNOSIS — Z95828 Presence of other vascular implants and grafts: Secondary | ICD-10-CM

## 2014-03-11 DIAGNOSIS — Z452 Encounter for adjustment and management of vascular access device: Secondary | ICD-10-CM

## 2014-03-11 LAB — CBC WITH DIFFERENTIAL/PLATELET
BASOS ABS: 0 10*3/uL (ref 0.0–0.1)
Basophils Relative: 0 % (ref 0–1)
Eosinophils Absolute: 0.2 10*3/uL (ref 0.0–0.7)
Eosinophils Relative: 1 % (ref 0–5)
HEMATOCRIT: 25.2 % — AB (ref 36.0–46.0)
HEMOGLOBIN: 8.6 g/dL — AB (ref 12.0–15.0)
Lymphocytes Relative: 14 % (ref 12–46)
Lymphs Abs: 2.4 10*3/uL (ref 0.7–4.0)
MCH: 34.5 pg — ABNORMAL HIGH (ref 26.0–34.0)
MCHC: 34.1 g/dL (ref 30.0–36.0)
MCV: 101.2 fL — ABNORMAL HIGH (ref 78.0–100.0)
MONOS PCT: 6 % (ref 3–12)
Monocytes Absolute: 1 10*3/uL (ref 0.1–1.0)
NEUTROS ABS: 13.4 10*3/uL — AB (ref 1.7–7.7)
NEUTROS PCT: 79 % — AB (ref 43–77)
Platelets: 150 10*3/uL (ref 150–400)
RBC: 2.49 MIL/uL — ABNORMAL LOW (ref 3.87–5.11)
RDW: 15.3 % (ref 11.5–15.5)
WBC: 16.9 10*3/uL — AB (ref 4.0–10.5)

## 2014-03-11 LAB — BASIC METABOLIC PANEL
BUN: 19 mg/dL (ref 6–23)
CHLORIDE: 98 meq/L (ref 96–112)
CO2: 27 mEq/L (ref 19–32)
Calcium: 10.4 mg/dL (ref 8.4–10.5)
Creatinine, Ser: 1.89 mg/dL — ABNORMAL HIGH (ref 0.50–1.10)
GFR calc non Af Amer: 31 mL/min — ABNORMAL LOW (ref >90)
GFR, EST AFRICAN AMERICAN: 36 mL/min — AB (ref >90)
Glucose, Bld: 113 mg/dL — ABNORMAL HIGH (ref 70–99)
POTASSIUM: 4.9 meq/L (ref 3.7–5.3)
Sodium: 136 mEq/L — ABNORMAL LOW (ref 137–147)

## 2014-03-11 MED ORDER — HEPARIN SOD (PORK) LOCK FLUSH 100 UNIT/ML IV SOLN
500.0000 [IU] | Freq: Once | INTRAVENOUS | Status: AC
  Administered 2014-03-11: 500 [IU] via INTRAVENOUS
  Filled 2014-03-11: qty 5

## 2014-03-11 MED ORDER — SODIUM CHLORIDE 0.9 % IJ SOLN
10.0000 mL | INTRAMUSCULAR | Status: DC | PRN
  Administered 2014-03-11: 10 mL via INTRAVENOUS

## 2014-03-11 NOTE — Progress Notes (Signed)
[name] presented for Portacath access and flush. Double Portacath located lt chest wall accessed medial side with  H 20 needle. Good blood return present. Portacath flushed with 25ml NS and 500U/52ml Heparin and needle removed intact. Procedure without incident. Patient tolerated procedure well.

## 2014-03-29 ENCOUNTER — Encounter (HOSPITAL_COMMUNITY)
Admission: RE | Admit: 2014-03-29 | Discharge: 2014-03-29 | Disposition: A | Discharge status: Home / Self Care | Payer: Medicaid Other | Source: Ambulatory Visit | Attending: Hematology and Oncology | Admitting: Hematology and Oncology

## 2014-03-29 DIAGNOSIS — Z9889 Other specified postprocedural states: Secondary | ICD-10-CM | POA: Diagnosis present

## 2014-03-29 LAB — BASIC METABOLIC PANEL
BUN: 20 mg/dL (ref 6–23)
CALCIUM: 10.6 mg/dL — AB (ref 8.4–10.5)
CHLORIDE: 105 meq/L (ref 96–112)
CO2: 25 meq/L (ref 19–32)
Creatinine, Ser: 2.21 mg/dL — ABNORMAL HIGH (ref 0.50–1.10)
GFR calc Af Amer: 30 mL/min — ABNORMAL LOW (ref >90)
GFR calc non Af Amer: 26 mL/min — ABNORMAL LOW (ref >90)
Glucose, Bld: 108 mg/dL — ABNORMAL HIGH (ref 70–99)
Potassium: 5.4 mEq/L — ABNORMAL HIGH (ref 3.7–5.3)
SODIUM: 143 meq/L (ref 137–147)

## 2014-03-29 MED ORDER — SODIUM CHLORIDE 0.9 % IJ SOLN
10.0000 mL | Freq: Once | INTRAMUSCULAR | Status: AC
  Administered 2014-03-29: 10 mL via INTRAVENOUS

## 2014-03-29 MED ORDER — SODIUM CHLORIDE 0.9 % IV SOLN
INTRAVENOUS | Status: DC
  Administered 2014-03-29: 10:00:00 via INTRAVENOUS

## 2014-03-30 ENCOUNTER — Encounter (HOSPITAL_COMMUNITY)
Admission: RE | Admit: 2014-03-30 | Discharge: 2014-03-30 | Disposition: A | Discharge status: Home / Self Care | Payer: Medicaid Other | Source: Ambulatory Visit | Attending: Hematology and Oncology | Admitting: Hematology and Oncology

## 2014-03-30 DIAGNOSIS — Z9889 Other specified postprocedural states: Secondary | ICD-10-CM | POA: Diagnosis not present

## 2014-03-30 LAB — BASIC METABOLIC PANEL
BUN: 16 mg/dL (ref 6–23)
CHLORIDE: 104 meq/L (ref 96–112)
CO2: 28 meq/L (ref 19–32)
Calcium: 10.7 mg/dL — ABNORMAL HIGH (ref 8.4–10.5)
Creatinine, Ser: 2.04 mg/dL — ABNORMAL HIGH (ref 0.50–1.10)
GFR calc Af Amer: 33 mL/min — ABNORMAL LOW (ref >90)
GFR calc non Af Amer: 29 mL/min — ABNORMAL LOW (ref >90)
Glucose, Bld: 126 mg/dL — ABNORMAL HIGH (ref 70–99)
POTASSIUM: 4.8 meq/L (ref 3.7–5.3)
SODIUM: 143 meq/L (ref 137–147)

## 2014-03-30 MED ORDER — SODIUM CHLORIDE 0.9 % IV SOLN
INTRAVENOUS | Status: DC
  Administered 2014-03-30: 1000 mL via INTRAVENOUS

## 2014-03-30 MED ORDER — HEPARIN SOD (PORK) LOCK FLUSH 100 UNIT/ML IV SOLN
500.0000 [IU] | Freq: Once | INTRAVENOUS | Status: AC
  Administered 2014-03-30: 500 [IU] via INTRAVENOUS

## 2014-03-30 MED ORDER — HEPARIN SOD (PORK) LOCK FLUSH 100 UNIT/ML IV SOLN
INTRAVENOUS | Status: AC
  Filled 2014-03-30: qty 5

## 2014-04-01 NOTE — Progress Notes (Signed)
Called pt to schedule Magnesium infusion as ordered by [place]. Unable to reach pt but left voice mail. Called office, talked with Rogene, RN to let them know that pt wasn't reached.

## 2014-05-14 ENCOUNTER — Encounter (HOSPITAL_COMMUNITY): Payer: Self-pay | Admitting: Emergency Medicine

## 2014-05-14 ENCOUNTER — Emergency Department (HOSPITAL_COMMUNITY)
Admission: EM | Admit: 2014-05-14 | Discharge: 2014-05-14 | Disposition: A | Discharge status: Home / Self Care | Payer: Medicaid Other | Attending: Emergency Medicine | Admitting: Emergency Medicine

## 2014-05-14 DIAGNOSIS — E039 Hypothyroidism, unspecified: Secondary | ICD-10-CM | POA: Insufficient documentation

## 2014-05-14 DIAGNOSIS — C9200 Acute myeloblastic leukemia, not having achieved remission: Secondary | ICD-10-CM | POA: Insufficient documentation

## 2014-05-14 DIAGNOSIS — I509 Heart failure, unspecified: Secondary | ICD-10-CM | POA: Diagnosis not present

## 2014-05-14 DIAGNOSIS — F411 Generalized anxiety disorder: Secondary | ICD-10-CM | POA: Insufficient documentation

## 2014-05-14 DIAGNOSIS — Z87828 Personal history of other (healed) physical injury and trauma: Secondary | ICD-10-CM | POA: Diagnosis not present

## 2014-05-14 DIAGNOSIS — Z79899 Other long term (current) drug therapy: Secondary | ICD-10-CM | POA: Diagnosis not present

## 2014-05-14 DIAGNOSIS — Z87891 Personal history of nicotine dependence: Secondary | ICD-10-CM | POA: Insufficient documentation

## 2014-05-14 DIAGNOSIS — Z9481 Bone marrow transplant status: Secondary | ICD-10-CM | POA: Insufficient documentation

## 2014-05-14 DIAGNOSIS — G43909 Migraine, unspecified, not intractable, without status migrainosus: Secondary | ICD-10-CM | POA: Insufficient documentation

## 2014-05-14 DIAGNOSIS — M2550 Pain in unspecified joint: Secondary | ICD-10-CM | POA: Diagnosis not present

## 2014-05-14 DIAGNOSIS — G8929 Other chronic pain: Secondary | ICD-10-CM | POA: Insufficient documentation

## 2014-05-14 DIAGNOSIS — E079 Disorder of thyroid, unspecified: Secondary | ICD-10-CM | POA: Insufficient documentation

## 2014-05-14 MED ORDER — OXYCODONE-ACETAMINOPHEN 5-325 MG PO TABS
1.0000 | ORAL_TABLET | Freq: Once | ORAL | Status: AC
  Administered 2014-05-14: 1 via ORAL
  Filled 2014-05-14: qty 1

## 2014-05-14 NOTE — ED Notes (Signed)
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  States she is out of her pain medication b/c she has been taking too many.  Is on 4mg  Dilaudid QID but states she has been taking 5 a day, most days.  Mother present w/patient and her demeanor and comments seem to indicate this may not be completely accurate.

## 2014-05-14 NOTE — ED Notes (Signed)
Patient with no complaints at this time. Respirations even and unlabored. Skin warm/dry. Discharge instructions reviewed with patient at this time. Patient given opportunity to voice concerns/ask questions. Patient discharged at this time and left Emergency Department with steady gait.   
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
## 2014-05-14 NOTE — Discharge Instructions (Signed)
You have # 120 nucynta prescribed on 7/18 that you can take for your chronic pain. You will need to contact your doctor to discuss your pain medications if you feel you need to be back on the dilaudid pain medications.   Chronic Pain Discharge Instructions  Emergency care providers appreciate that many patients coming to [place] are in severe pain and we wish to address their pain in the safest, most responsible manner.  It is important to recognize however, that the proper treatment of chronic pain differs from that of the pain of injuries and acute illnesses.  Our goal is to provide quality, safe, personalized care and we thank you for giving [place] the opportunity to serve you. The use of narcotics and related agents for chronic pain syndromes may lead to additional physical and psychological problems.  Nearly as many people die from prescription narcotics each year as die from car crashes.  Additionally, this risk is increased if such prescriptions are obtained from a variety of sources.  Therefore, only your primary care physician or a pain management specialist is able to safely treat such syndromes with narcotic medications long-term.    Documentation revealing such prescriptions have been sought from multiple sources may prohibit [place] from providing a refill or different narcotic medication.  Your name may be checked first through the [place].  This database is a record of controlled substance medication prescriptions that the patient has received.  This has been established by [facility] in an effort to eliminate the dangerous, and often life threatening, practice of obtaining multiple prescriptions from different medical providers.   If you have a chronic pain syndrome (i.e. chronic headaches, recurrent back or neck pain, dental pain, abdominal or pelvis pain without a specific diagnosis, or neuropathic pain such as fibromyalgia) or recurrent visits for the  same condition without an acute diagnosis, you may be treated with non-narcotics and other non-addictive medicines.  Allergic reactions or negative side effects that may be reported by a patient to such medications will not typically lead to the use of a narcotic analgesic or other controlled substance as an alternative.   Patients managing chronic pain with a personal physician should have provisions in place for breakthrough pain.  If you are in crisis, you should call your physician.  If your physician directs you to the emergency department, please have the doctor call and speak to our attending physician concerning your care.   When patients come to the Emergency Department (ED) with acute medical conditions in which the Emergency Department physician feels appropriate to prescribe narcotic or sedating pain medication, the physician will prescribe these in very limited quantities.  The amount of these medications will last only until you can see your primary care physician in his/her office.  Any patient who returns to the ED seeking refills should expect only non-narcotic pain medications.   In the event of an acute medical condition exists and the emergency physician feels it is necessary that the patient be given a narcotic or sedating medication -  a responsible adult driver should be present in the room prior to the medication being given by the nurse.   Prescriptions for narcotic or sedating medications that have been lost, stolen or expired will not be refilled in the Emergency Department.    Patients who have chronic pain may receive non-narcotic prescriptions until seen by their primary care physician.  It is every patients personal responsibility to maintain active prescriptions with  his or her primary care physician or specialist. And a

## 2014-05-14 NOTE — ED Notes (Signed)
Patient states need for Rx for pain due to "running out. I see my MD on Monday and just need enough to get through the weekend." Patient alert/oriented, pale, with c/o chronic joint pain since Bone marrow transplant. Denies any other complaints.

## 2014-05-14 NOTE — ED Notes (Signed)
Out to desk stating she is "just going to sign out, that she is cold." Informed that MD has signed up for her and would be in shortly. Patient states she will stay "a little longer. Offered blanket, patient declined stating she had two.

## 2014-05-19 ENCOUNTER — Other Ambulatory Visit: Payer: Self-pay | Admitting: Obstetrics and Gynecology

## 2014-08-22 ENCOUNTER — Encounter (HOSPITAL_COMMUNITY): Payer: Self-pay | Admitting: Emergency Medicine

## 2015-04-01 ENCOUNTER — Emergency Department (HOSPITAL_COMMUNITY): Payer: Medicaid Other

## 2015-04-01 ENCOUNTER — Encounter (HOSPITAL_COMMUNITY): Payer: Self-pay | Admitting: *Deleted

## 2015-04-01 ENCOUNTER — Emergency Department (HOSPITAL_COMMUNITY)
Admission: EM | Admit: 2015-04-01 | Discharge: 2015-04-02 | Disposition: A | Discharge status: Nursing Home (Includes ICF) | Payer: Medicaid Other | Attending: Emergency Medicine | Admitting: Emergency Medicine

## 2015-04-01 DIAGNOSIS — K838 Other specified diseases of biliary tract: Secondary | ICD-10-CM | POA: Diagnosis not present

## 2015-04-01 DIAGNOSIS — F419 Anxiety disorder, unspecified: Secondary | ICD-10-CM | POA: Insufficient documentation

## 2015-04-01 DIAGNOSIS — R109 Unspecified abdominal pain: Secondary | ICD-10-CM | POA: Diagnosis present

## 2015-04-01 DIAGNOSIS — R1013 Epigastric pain: Secondary | ICD-10-CM | POA: Diagnosis not present

## 2015-04-01 DIAGNOSIS — Z90710 Acquired absence of both cervix and uterus: Secondary | ICD-10-CM | POA: Insufficient documentation

## 2015-04-01 DIAGNOSIS — G8929 Other chronic pain: Secondary | ICD-10-CM | POA: Diagnosis not present

## 2015-04-01 DIAGNOSIS — Z856 Personal history of leukemia: Secondary | ICD-10-CM | POA: Insufficient documentation

## 2015-04-01 DIAGNOSIS — D696 Thrombocytopenia, unspecified: Secondary | ICD-10-CM | POA: Diagnosis not present

## 2015-04-01 DIAGNOSIS — Z8739 Personal history of other diseases of the musculoskeletal system and connective tissue: Secondary | ICD-10-CM | POA: Diagnosis not present

## 2015-04-01 DIAGNOSIS — E039 Hypothyroidism, unspecified: Secondary | ICD-10-CM | POA: Insufficient documentation

## 2015-04-01 DIAGNOSIS — K5900 Constipation, unspecified: Secondary | ICD-10-CM | POA: Diagnosis not present

## 2015-04-01 DIAGNOSIS — R112 Nausea with vomiting, unspecified: Secondary | ICD-10-CM | POA: Insufficient documentation

## 2015-04-01 DIAGNOSIS — Z95810 Presence of automatic (implantable) cardiac defibrillator: Secondary | ICD-10-CM | POA: Diagnosis not present

## 2015-04-01 DIAGNOSIS — D72819 Decreased white blood cell count, unspecified: Secondary | ICD-10-CM | POA: Diagnosis not present

## 2015-04-01 DIAGNOSIS — Z9851 Tubal ligation status: Secondary | ICD-10-CM | POA: Diagnosis not present

## 2015-04-01 DIAGNOSIS — Z87891 Personal history of nicotine dependence: Secondary | ICD-10-CM | POA: Diagnosis not present

## 2015-04-01 DIAGNOSIS — R509 Fever, unspecified: Secondary | ICD-10-CM | POA: Insufficient documentation

## 2015-04-01 DIAGNOSIS — I509 Heart failure, unspecified: Secondary | ICD-10-CM | POA: Insufficient documentation

## 2015-04-01 DIAGNOSIS — G43909 Migraine, unspecified, not intractable, without status migrainosus: Secondary | ICD-10-CM | POA: Diagnosis not present

## 2015-04-01 DIAGNOSIS — Z79899 Other long term (current) drug therapy: Secondary | ICD-10-CM | POA: Insufficient documentation

## 2015-04-01 LAB — COMPREHENSIVE METABOLIC PANEL
ALBUMIN: 3.7 g/dL (ref 3.5–5.0)
ALT: 47 U/L (ref 14–54)
ANION GAP: 7 (ref 5–15)
AST: 43 U/L — AB (ref 15–41)
Alkaline Phosphatase: 190 U/L — ABNORMAL HIGH (ref 38–126)
BILIRUBIN TOTAL: 0.6 mg/dL (ref 0.3–1.2)
BUN: 13 mg/dL (ref 6–20)
CHLORIDE: 104 mmol/L (ref 101–111)
CO2: 27 mmol/L (ref 22–32)
CREATININE: 0.74 mg/dL (ref 0.44–1.00)
Calcium: 9.1 mg/dL (ref 8.9–10.3)
GFR calc non Af Amer: >60 mL/min (ref >60)
Glucose, Bld: 104 mg/dL — ABNORMAL HIGH (ref 65–99)
Potassium: 4.2 mmol/L (ref 3.5–5.1)
SODIUM: 138 mmol/L (ref 135–145)
Total Protein: 6.2 g/dL — ABNORMAL LOW (ref 6.5–8.1)

## 2015-04-01 LAB — CBC WITH DIFFERENTIAL/PLATELET
BASOS PCT: 0 % (ref 0–1)
Basophils Absolute: 0 10*3/uL (ref 0.0–0.1)
EOS PCT: 1 % (ref 0–5)
Eosinophils Absolute: 0 10*3/uL (ref 0.0–0.7)
HEMATOCRIT: 29.4 % — AB (ref 36.0–46.0)
Hemoglobin: 10.1 g/dL — ABNORMAL LOW (ref 12.0–15.0)
Lymphocytes Relative: 10 % — ABNORMAL LOW (ref 12–46)
Lymphs Abs: 0.3 10*3/uL — ABNORMAL LOW (ref 0.7–4.0)
MCH: 30.5 pg (ref 26.0–34.0)
MCHC: 34.4 g/dL (ref 30.0–36.0)
MCV: 88.8 fL (ref 78.0–100.0)
Monocytes Absolute: 0.2 10*3/uL (ref 0.1–1.0)
Monocytes Relative: 8 % (ref 3–12)
NEUTROS ABS: 2.6 10*3/uL (ref 1.7–7.7)
Neutrophils Relative %: 82 % — ABNORMAL HIGH (ref 43–77)
Platelets: 19 10*3/uL — CL (ref 150–400)
RBC: 3.31 MIL/uL — AB (ref 3.87–5.11)
RDW: 14.6 % (ref 11.5–15.5)
WBC: 3.1 10*3/uL — ABNORMAL LOW (ref 4.0–10.5)

## 2015-04-01 LAB — URINALYSIS, ROUTINE W REFLEX MICROSCOPIC
BILIRUBIN URINE: NEGATIVE
Glucose, UA: NEGATIVE mg/dL
KETONES UR: NEGATIVE mg/dL
Leukocytes, UA: NEGATIVE
NITRITE: NEGATIVE
PH: 8.5 — AB (ref 5.0–8.0)
SPECIFIC GRAVITY, URINE: 1.015 (ref 1.005–1.030)
Urobilinogen, UA: 0.2 mg/dL (ref 0.0–1.0)

## 2015-04-01 LAB — I-STAT CG4 LACTIC ACID, ED
Lactic Acid, Venous: 1.25 mmol/L (ref 0.5–2.0)
Lactic Acid, Venous: 0.37 mmol/L — ABNORMAL LOW (ref 0.5–2.0)

## 2015-04-01 LAB — URINE MICROSCOPIC-ADD ON

## 2015-04-01 LAB — TROPONIN I: Troponin I: 0.03 ng/mL (ref <0.031)

## 2015-04-01 LAB — LIPASE, BLOOD: LIPASE: 10 U/L — AB (ref 22–51)

## 2015-04-01 MED ORDER — ACETAMINOPHEN 325 MG PO TABS
650.0000 mg | ORAL_TABLET | Freq: Once | ORAL | Status: AC
  Administered 2015-04-01: 650 mg via ORAL
  Filled 2015-04-01: qty 2

## 2015-04-01 MED ORDER — IOHEXOL 300 MG/ML  SOLN
50.0000 mL | Freq: Once | INTRAMUSCULAR | Status: AC | PRN
  Administered 2015-04-01: 50 mL via ORAL

## 2015-04-01 MED ORDER — ONDANSETRON HCL 4 MG/2ML IJ SOLN
4.0000 mg | Freq: Once | INTRAMUSCULAR | Status: AC
  Administered 2015-04-01: 4 mg via INTRAVENOUS
  Filled 2015-04-01: qty 2

## 2015-04-01 MED ORDER — SODIUM CHLORIDE 0.9 % IV BOLUS (SEPSIS)
1000.0000 mL | Freq: Once | INTRAVENOUS | Status: AC
  Administered 2015-04-01: 1000 mL via INTRAVENOUS

## 2015-04-01 MED ORDER — LORAZEPAM 2 MG/ML IJ SOLN
1.0000 mg | Freq: Once | INTRAMUSCULAR | Status: AC
  Administered 2015-04-01: 1 mg via INTRAVENOUS
  Filled 2015-04-01: qty 1

## 2015-04-01 MED ORDER — SODIUM CHLORIDE 0.9 % IV SOLN
INTRAVENOUS | Status: DC
  Administered 2015-04-01: 21:00:00 via INTRAVENOUS

## 2015-04-01 MED ORDER — IOHEXOL 300 MG/ML  SOLN
100.0000 mL | Freq: Once | INTRAMUSCULAR | Status: AC | PRN
  Administered 2015-04-01: 100 mL via INTRAVENOUS

## 2015-04-01 MED ORDER — HYDROMORPHONE HCL 1 MG/ML IJ SOLN
1.0000 mg | Freq: Once | INTRAMUSCULAR | Status: AC
  Administered 2015-04-01: 1 mg via INTRAVENOUS
  Filled 2015-04-01: qty 1

## 2015-04-01 NOTE — ED Notes (Signed)
Patient transported back from CT 

## 2015-04-01 NOTE — ED Notes (Signed)
Pt is in between chemo. Pt states she has had abdominal pain since yesterday and triad milk of magnesia with no relief. Pt states she vomited about an hour ago. Pt has a temp of 100.4 after drinking water.
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
## 2015-04-01 NOTE — ED Notes (Signed)
Patient transported to CT 

## 2015-04-01 NOTE — ED Notes (Signed)
CRITICAL VALUE ALERT  Critical value received:  Platelet 19 thousand  Date of notification:  04/01/2015  Time of notification:  2011  Critical value read back: yes  Nurse who received alert:  P,.Nadeem Romanoski,rn  MD notified (1st page):  [email or web address]  Time of first page:    MD notified (2nd page):  Time of second page:  Responding MD:  [email or web address]  Time MD responded:  2011

## 2015-04-02 DIAGNOSIS — D696 Thrombocytopenia, unspecified: Secondary | ICD-10-CM | POA: Diagnosis not present

## 2015-04-02 DIAGNOSIS — R109 Unspecified abdominal pain: Secondary | ICD-10-CM | POA: Diagnosis present

## 2015-04-02 DIAGNOSIS — F419 Anxiety disorder, unspecified: Secondary | ICD-10-CM | POA: Diagnosis not present

## 2015-04-02 DIAGNOSIS — D72819 Decreased white blood cell count, unspecified: Secondary | ICD-10-CM | POA: Diagnosis not present

## 2015-04-02 DIAGNOSIS — K5900 Constipation, unspecified: Secondary | ICD-10-CM | POA: Diagnosis not present

## 2015-04-02 DIAGNOSIS — Z856 Personal history of leukemia: Secondary | ICD-10-CM | POA: Diagnosis not present

## 2015-04-02 DIAGNOSIS — G8929 Other chronic pain: Secondary | ICD-10-CM | POA: Diagnosis not present

## 2015-04-02 DIAGNOSIS — Z90710 Acquired absence of both cervix and uterus: Secondary | ICD-10-CM | POA: Diagnosis not present

## 2015-04-02 DIAGNOSIS — K838 Other specified diseases of biliary tract: Secondary | ICD-10-CM | POA: Diagnosis not present

## 2015-04-02 DIAGNOSIS — G43909 Migraine, unspecified, not intractable, without status migrainosus: Secondary | ICD-10-CM | POA: Diagnosis not present

## 2015-04-02 DIAGNOSIS — R1013 Epigastric pain: Secondary | ICD-10-CM | POA: Diagnosis not present

## 2015-04-02 DIAGNOSIS — Z87891 Personal history of nicotine dependence: Secondary | ICD-10-CM | POA: Diagnosis not present

## 2015-04-02 DIAGNOSIS — E039 Hypothyroidism, unspecified: Secondary | ICD-10-CM | POA: Diagnosis not present

## 2015-04-02 DIAGNOSIS — Z9851 Tubal ligation status: Secondary | ICD-10-CM | POA: Diagnosis not present

## 2015-04-02 DIAGNOSIS — I509 Heart failure, unspecified: Secondary | ICD-10-CM | POA: Diagnosis not present

## 2015-04-02 DIAGNOSIS — Z8739 Personal history of other diseases of the musculoskeletal system and connective tissue: Secondary | ICD-10-CM | POA: Diagnosis not present

## 2015-04-02 DIAGNOSIS — Z79899 Other long term (current) drug therapy: Secondary | ICD-10-CM | POA: Diagnosis not present

## 2015-04-02 DIAGNOSIS — Z95810 Presence of automatic (implantable) cardiac defibrillator: Secondary | ICD-10-CM | POA: Diagnosis not present

## 2015-04-02 DIAGNOSIS — R509 Fever, unspecified: Secondary | ICD-10-CM | POA: Diagnosis not present

## 2015-04-02 DIAGNOSIS — R112 Nausea with vomiting, unspecified: Secondary | ICD-10-CM | POA: Diagnosis not present

## 2015-04-02 MED ORDER — HYDROMORPHONE HCL 2 MG/ML IJ SOLN
2.0000 mg | Freq: Once | INTRAMUSCULAR | Status: AC
  Administered 2015-04-02: 2 mg via INTRAVENOUS
  Filled 2015-04-02: qty 1

## 2015-04-02 MED ORDER — PIPERACILLIN-TAZOBACTAM 3.375 G IVPB 30 MIN
3.3750 g | Freq: Once | INTRAVENOUS | Status: AC
  Administered 2015-04-02: 3.375 g via INTRAVENOUS
  Filled 2015-04-02: qty 50

## 2015-04-02 MED ORDER — HYDROMORPHONE HCL 1 MG/ML IJ SOLN
1.0000 mg | INTRAMUSCULAR | Status: DC | PRN
  Administered 2015-04-02 (×2): 1 mg via INTRAVENOUS
  Filled 2015-04-02 (×2): qty 1

## 2015-04-02 NOTE — ED Provider Notes (Signed)
Patient's signed out pending imaging. History of AML. She continues to receive treatments but treatments are on hold secondary to pancytopenia. 2 day history of abdominal pain and fevers. Platelet count of 19 and hematocrit of 29.4. Otherwise workup was largely reassuring including LFTs, lipase, and lactate. CT scan shows thickened gallbladder wall and dilated common bile duct. Recommend ERCP. Given patient's pancytopenia, she would be very high risk for intervention. Discussed with Dr. Lissa Merlin on call for oncology at [facility]. She has accepted the patient in transfer. Will medically manage. Patient given IV Zosyn prior to transfer.  Results for orders placed or performed during the hospital encounter of 04/01/15  Culture, blood (routine x 2)  Result Value Ref Range   Specimen Description PORTA CATH    Special Requests      BOTTLES DRAWN AEROBIC AND ANAEROBIC [place] DRAWN BY RN   Culture PENDING    Report Status PENDING   Culture, blood (routine x 2)  Result Value Ref Range   Specimen Description RIGHT ANTECUBITAL    Special Requests BOTTLES DRAWN AEROBIC AND ANAEROBIC 6CC    Culture PENDING    Report Status PENDING   CBC with Differential  Result Value Ref Range   WBC 3.1 (L) 4.0 - 10.5 K/uL   RBC 3.31 (L) 3.87 - 5.11 MIL/uL   Hemoglobin 10.1 (L) 12.0 - 15.0 g/dL   HCT 29.4 (L) 36.0 - 46.0 %   MCV 88.8 78.0 - 100.0 fL   MCH 30.5 26.0 - 34.0 pg   MCHC 34.4 30.0 - 36.0 g/dL   RDW 14.6 11.5 - 15.5 %   Platelets 19 (LL) 150 - 400 K/uL   Neutrophils Relative % 82 (H) 43 - 77 %   Neutro Abs 2.6 1.7 - 7.7 K/uL   Lymphocytes Relative 10 (L) 12 - 46 %   Lymphs Abs 0.3 (L) 0.7 - 4.0 K/uL   Monocytes Relative 8 3 - 12 %   Monocytes Absolute 0.2 0.1 - 1.0 K/uL   Eosinophils Relative 1 0 - 5 %   Eosinophils Absolute 0.0 0.0 - 0.7 K/uL   Basophils Relative 0 0 - 1 %   Basophils Absolute 0.0 0.0 - 0.1 K/uL  Comprehensive metabolic panel  Result Value Ref Range   Sodium  138 135 - 145 mmol/L   Potassium 4.2 3.5 - 5.1 mmol/L   Chloride 104 101 - 111 mmol/L   CO2 27 22 - 32 mmol/L   Glucose, Bld 104 (H) 65 - 99 mg/dL   BUN 13 6 - 20 mg/dL   Creatinine, Ser 0.74 0.44 - 1.00 mg/dL   Calcium 9.1 8.9 - 10.3 mg/dL   Total Protein 6.2 (L) 6.5 - 8.1 g/dL   Albumin 3.7 3.5 - 5.0 g/dL   AST 43 (H) 15 - 41 U/L   ALT 47 14 - 54 U/L   Alkaline Phosphatase 190 (H) 38 - 126 U/L   Total Bilirubin 0.6 0.3 - 1.2 mg/dL   GFR calc non Af Amer >60 >60 mL/min   GFR calc Af Amer >60 >60 mL/min   Anion gap 7 5 - 15  Lipase, blood  Result Value Ref Range   Lipase 10 (L) 22 - 51 U/L  Troponin I  (only if pt is 45 y.o. or older and pain is above umbilicus)  Result Value Ref Range   Troponin I 0.03 <0.031 ng/mL  Urinalysis, Routine w reflex microscopic (not at [facility])  Result Value Ref Range  Color, Urine YELLOW YELLOW   APPearance CLEAR CLEAR   Specific Gravity, Urine 1.015 1.005 - 1.030   pH 8.5 (H) 5.0 - 8.0   Glucose, UA NEGATIVE NEGATIVE mg/dL   Hgb urine dipstick TRACE (A) NEGATIVE   Bilirubin Urine NEGATIVE NEGATIVE   Ketones, ur NEGATIVE NEGATIVE mg/dL   Protein, ur TRACE (A) NEGATIVE mg/dL   Urobilinogen, UA 0.2 0.0 - 1.0 mg/dL   Nitrite NEGATIVE NEGATIVE   Leukocytes, UA NEGATIVE NEGATIVE  Urine microscopic-add on  Result Value Ref Range   Squamous Epithelial / LPF FEW (A) RARE   WBC, UA 0-2 <3 WBC/hpf   RBC / HPF 3-6 <3 RBC/hpf   Bacteria, UA RARE RARE  I-Stat CG4 Lactic Acid, ED  Result Value Ref Range   Lactic Acid, Venous 1.25 0.5 - 2.0 mmol/L  I-Stat CG4 Lactic Acid, ED  Result Value Ref Range   Lactic Acid, Venous 0.37 (L) 0.5 - 2.0 mmol/L   Ct Abdomen Pelvis W Contrast  04/01/2015   CLINICAL DATA:  Acute onset of left lower quadrant abdominal pain. Vomiting and fever. Initial encounter.  EXAM: CT ABDOMEN AND PELVIS WITH CONTRAST  TECHNIQUE: Multidetector CT imaging of the abdomen and pelvis was performed using the standard protocol following  bolus administration of intravenous contrast.  CONTRAST:  114mL OMNIPAQUE IOHEXOL 300 MG/ML  SOLN  COMPARISON:  MRI of the lumbar spine performed 04/27/2008  FINDINGS: The visualized lung bases are clear.  The liver and spleen are unremarkable in appearance. There is mild apparent gallbladder wall thickening, and the common bile duct is dilated to 1.2 cm in maximal diameter. This raises concern for some degree of obstruction, though minimal if any intrahepatic biliary duct dilatation is seen. The pancreas and adrenal glands are unremarkable.  Minimal left-sided renal scarring is noted. The kidneys are otherwise unremarkable. There is no evidence of hydronephrosis. No renal or ureteral stones are seen. No perinephric stranding is appreciated.  Trace fluid within the pelvis may be physiologic in nature, tracking about adjacent bowel loops. The small bowel is unremarkable in appearance. The stomach is within normal limits. No acute vascular abnormalities are seen. Minimal calcification is noted along the distal abdominal aorta and its branches.  The appendix is not well characterized. There is no evidence for appendicitis. The colon is partially filled with stool and is unremarkable in appearance. The sigmoid colon is mildly redundant.  The bladder is mildly distended and grossly unremarkable. The patient is status post hysterectomy. No suspicious adnexal masses are seen. No inguinal lymphadenopathy is seen.  No acute osseous abnormalities are identified. Chronic deformities of the left superior and inferior pubic rami are noted. Two screws are noted within the left ilium.  IMPRESSION: 1. Mild apparent gallbladder wall thickening, with dilatation of the common bile duct to 1.2 cm in maximal diameter. This raises concern for some degree of obstruction, though minimal if any intrahepatic biliary ductal dilatation is seen, and though the patient's total bilirubin remains normal. MRCP or ERCP could be considered for  further evaluation, as deemed clinically appropriate. 2. Trace fluid within the pelvis may be physiologic in nature. Adjacent bowel loops are unremarkable.   Electronically Signed   By: Garald Balding M.D.   On: 04/01/2015 23:26   Dg Chest Port 1 View  04/01/2015   CLINICAL DATA:  Left upper quadrant abdominal pain, onset yesterday after a blood transfusion the day before. Vomiting and fever began today.  EXAM: PORTABLE CHEST - 1 VIEW  COMPARISON:  11/03/2013  FINDINGS: There is a left-sided Port-A-Cath with tip in the SVC. The lungs are clear. Hilar, mediastinal and cardiac contours are normal unchanged. There is no large effusion. Pulmonary vasculature is normal.  IMPRESSION: No acute cardiopulmonary findings.   Electronically Signed   By: Andreas Newport M.D.   On: 04/01/2015 21:39      Merryl Hacker, MD 04/02/15 [phone]

## 2015-04-02 NOTE — ED Notes (Signed)
Patient verbalizes understanding of discharge and transfer to [facility] [address] room10. Patient out of department via [facility] at this time.

## 2015-04-03 LAB — URINE CULTURE
CULTURE: NO GROWTH
Colony Count: NO GROWTH

## 2015-04-03 MED FILL — Hydromorphone HCl Inj 2 MG/ML: INTRAMUSCULAR | Qty: 1 | Status: AC

## 2015-04-10 LAB — CULTURE, BLOOD (ROUTINE X 2)
Culture: NO GROWTH
Culture: NO GROWTH

## 2015-05-18 IMAGING — CR DG CHEST 2V
2 series · 2 of 2 positions shown · non-contrast
Comparison: 06/01/2009.

CLINICAL DATA: Fever.  Completed chemotherapy for AML.

EXAM:
CHEST  2 VIEW

[view not recorded (1 of 2)]
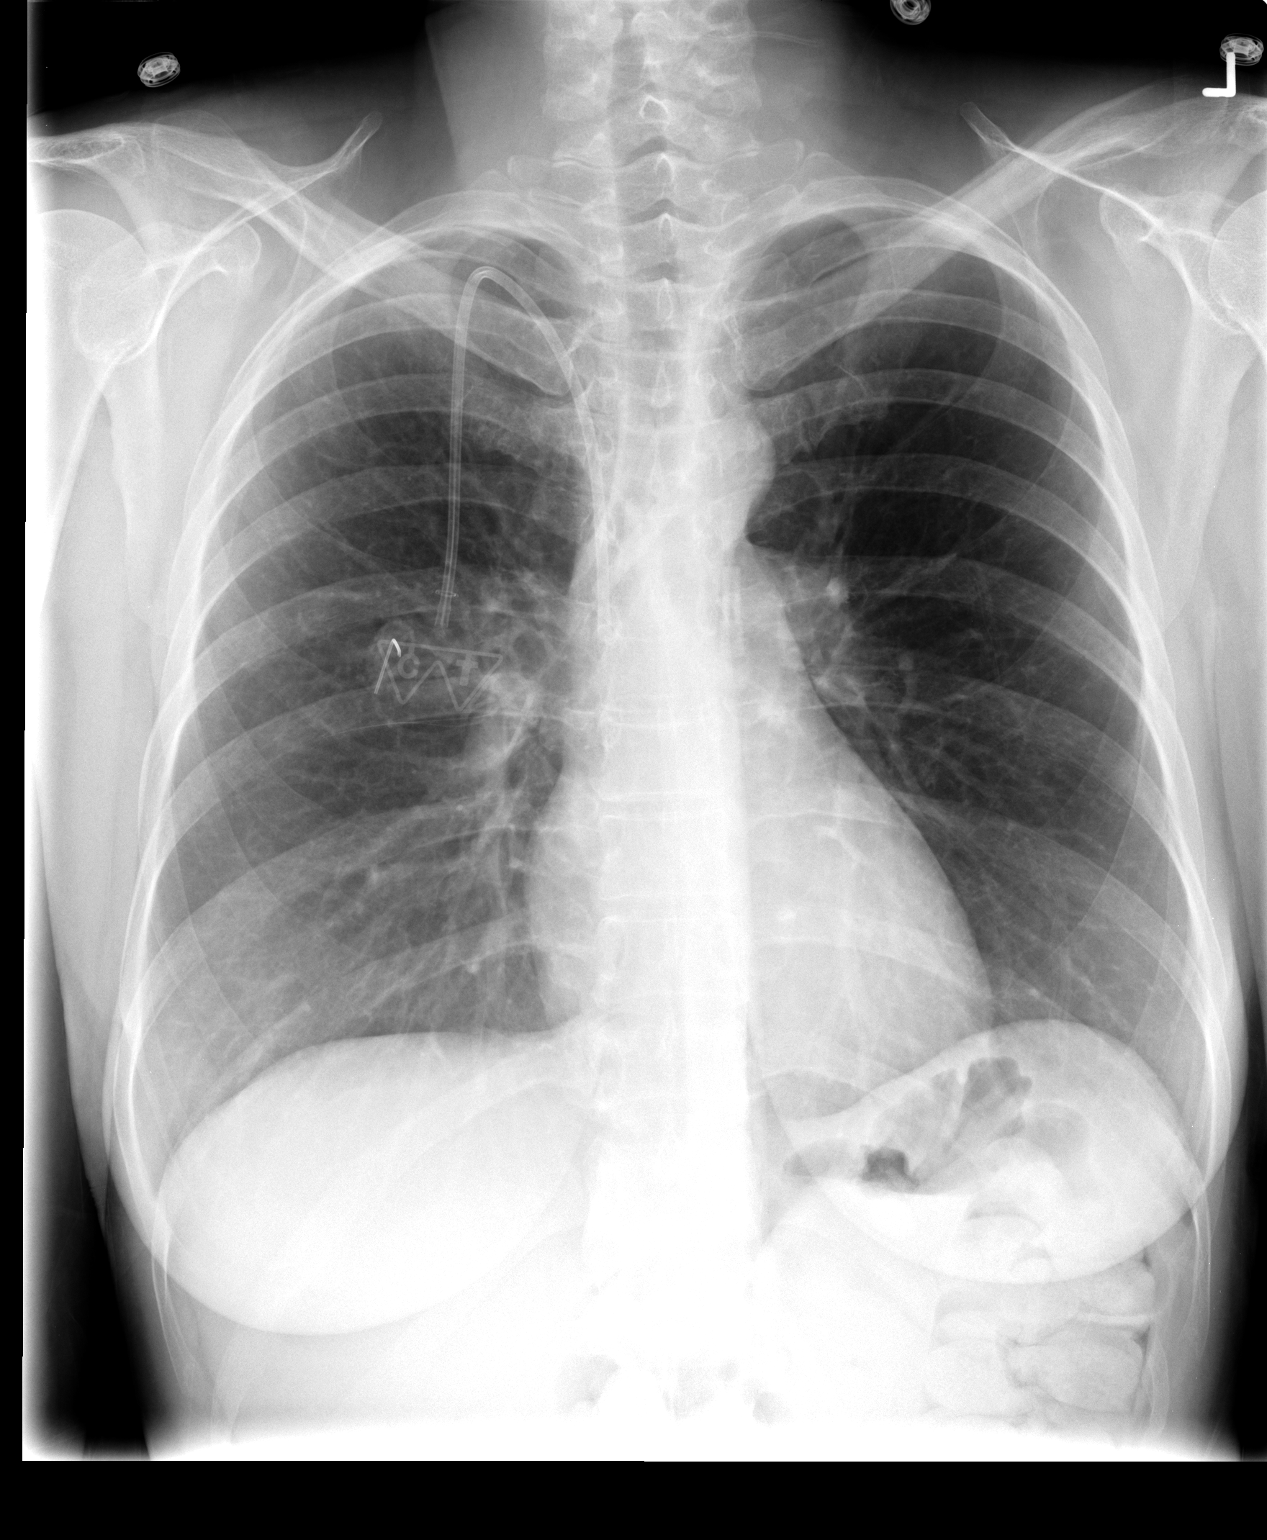

[view not recorded (2 of 2)]
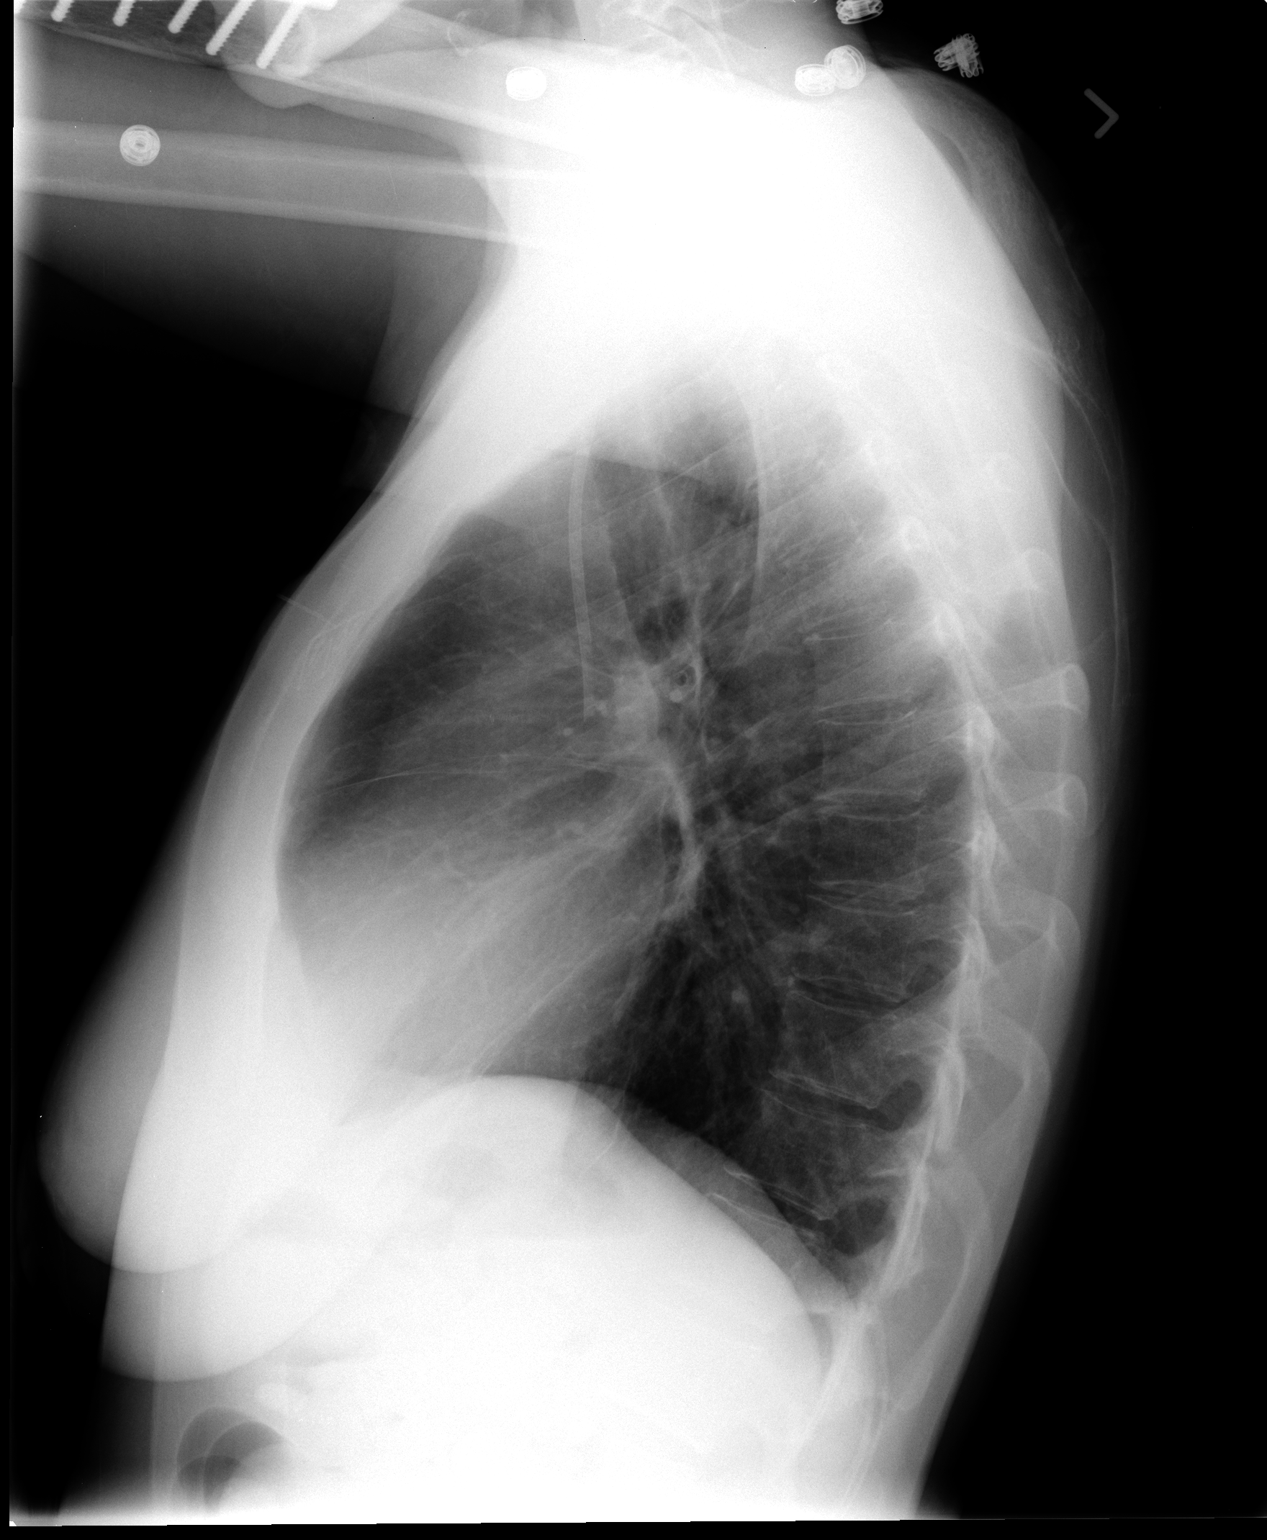

[2 of 2 positions shown; findings below may reference images not displayed]

FINDINGS: Normal sized heart. Clear lungs. Right jugular port catheter tip in
the superior vena cava. Minimal thoracic spine degenerative changes.
IMPRESSION: No acute abnormality.

## 2015-07-22 IMAGING — CR DG CHEST 1V PORT
1 series · 1 of 1 positions shown · non-contrast
Comparison: 08/30/2013

CLINICAL DATA: Fever.  Leukemia

EXAM:
PORTABLE CHEST - 1 VIEW

[portable]
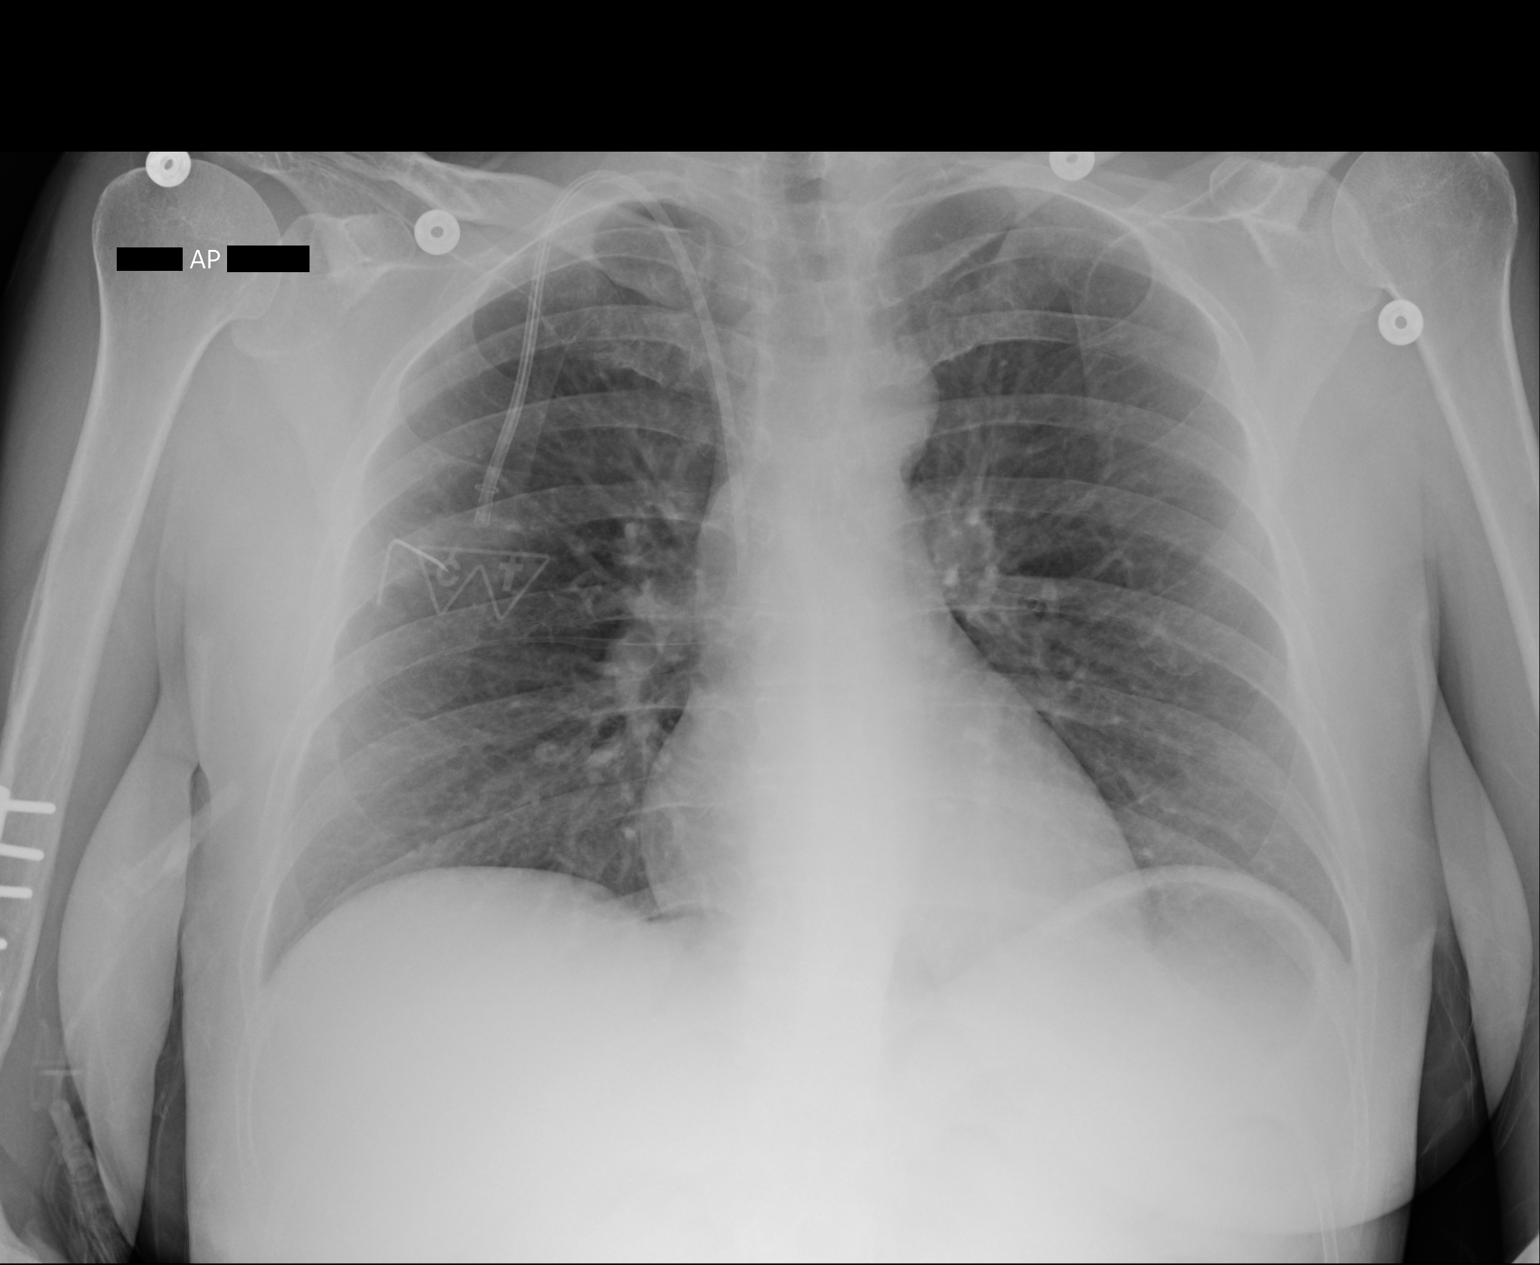

[1 of 1 positions shown; findings below may reference images not displayed]

FINDINGS: Right IJ porta catheter in stable position. Normal heart size. No
acute infiltrate or edema. No effusion or pneumothorax.
IMPRESSION: No evidence of acute cardiopulmonary disease.

## 2015-10-22 DEATH — deceased
